# Patient Record
Sex: Male | Born: 1950 | Race: White | Hispanic: No | Marital: Single | Smoking: Current every day smoker
Health system: Southern US, Community
[De-identification: ages and names within clinical notes are randomized; demographics above are authoritative.]

## PROBLEM LIST (undated history)

## (undated) DIAGNOSIS — M1990 Unspecified osteoarthritis, unspecified site: Secondary | ICD-10-CM

## (undated) DIAGNOSIS — C73 Malignant neoplasm of thyroid gland: Secondary | ICD-10-CM

## (undated) DIAGNOSIS — M51369 Other intervertebral disc degeneration, lumbar region without mention of lumbar back pain or lower extremity pain: Secondary | ICD-10-CM

## (undated) DIAGNOSIS — H409 Unspecified glaucoma: Secondary | ICD-10-CM

## (undated) DIAGNOSIS — M545 Low back pain, unspecified: Secondary | ICD-10-CM

## (undated) DIAGNOSIS — C419 Malignant neoplasm of bone and articular cartilage, unspecified: Secondary | ICD-10-CM

## (undated) DIAGNOSIS — R0602 Shortness of breath: Secondary | ICD-10-CM

## (undated) DIAGNOSIS — T4145XA Adverse effect of unspecified anesthetic, initial encounter: Secondary | ICD-10-CM

## (undated) DIAGNOSIS — G4700 Insomnia, unspecified: Secondary | ICD-10-CM

## (undated) DIAGNOSIS — M5136 Other intervertebral disc degeneration, lumbar region: Secondary | ICD-10-CM

## (undated) DIAGNOSIS — F410 Panic disorder [episodic paroxysmal anxiety] without agoraphobia: Secondary | ICD-10-CM

## (undated) DIAGNOSIS — G709 Myoneural disorder, unspecified: Secondary | ICD-10-CM

## (undated) DIAGNOSIS — M792 Neuralgia and neuritis, unspecified: Secondary | ICD-10-CM

## (undated) DIAGNOSIS — F419 Anxiety disorder, unspecified: Secondary | ICD-10-CM

## (undated) DIAGNOSIS — Z923 Personal history of irradiation: Secondary | ICD-10-CM

## (undated) DIAGNOSIS — G8929 Other chronic pain: Secondary | ICD-10-CM

## (undated) HISTORY — DX: Personal history of irradiation: Z92.3

## (undated) HISTORY — DX: Unspecified glaucoma: H40.9

---
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
## 2012-07-08 ENCOUNTER — Ambulatory Visit (INDEPENDENT_AMBULATORY_CARE_PROVIDER_SITE_OTHER): Payer: Federal, State, Local not specified - PPO | Admitting: Family Medicine

## 2012-07-08 VITALS — BP 120/75 | HR 71 | Temp 98.4°F | Resp 16 | Ht 76.0 in | Wt 200.0 lb

## 2012-07-08 DIAGNOSIS — D1779 Benign lipomatous neoplasm of other sites: Secondary | ICD-10-CM

## 2012-07-08 DIAGNOSIS — M47812 Spondylosis without myelopathy or radiculopathy, cervical region: Secondary | ICD-10-CM

## 2012-07-08 DIAGNOSIS — M5116 Intervertebral disc disorders with radiculopathy, lumbar region: Secondary | ICD-10-CM

## 2012-07-08 DIAGNOSIS — D171 Benign lipomatous neoplasm of skin and subcutaneous tissue of trunk: Secondary | ICD-10-CM

## 2012-07-08 DIAGNOSIS — M5126 Other intervertebral disc displacement, lumbar region: Secondary | ICD-10-CM

## 2012-07-08 MED ORDER — CYCLOBENZAPRINE HCL 5 MG PO TABS: ORAL_TABLET | ORAL | Status: DC

## 2012-07-08 MED ORDER — OXAPROZIN 600 MG PO TABS: ORAL_TABLET | ORAL | Status: DC

## 2012-07-08 MED ORDER — PREDNISONE 20 MG PO TABS
ORAL_TABLET | ORAL | Status: DC
Start: 2012-07-08 — End: 2012-07-20

## 2012-07-08 NOTE — Progress Notes (Signed)
Subjective: 15 yr hx of L 3-4 disc disease followed by a chiropracter.  No defined injury. He is retired. He would like to be able to be more active, doing things like cough, but has not been able to play since the spring. He S. pain in his neck, but this didn't relieve by chiropractory and traction. The back has persisted in bothering him with pain in actually he is more in the left hip area radiating down toward the lateral aspect of the knee. He cannot walk very far, and after a quarter mile or so gets bad enough pain that he has to sit down. He likes to lay on his side to sleep, and is not able to do so because of the pain. He feels exhausted.  Objective: Renae Fickle man with stiffness of movement. Neck has some decreased range of motion. Not that painful tender day. Lumbar spine has very decreased side to side motion. Fair flexion, poor extension. He has prominence of the soft tissues overlying the left posterior iliac spine. This is soft, probably lipoma, possibly just spasming. Benign to palpation straight leg raising test is adequate bilaterally. No real tenderness of the greater trochanter.  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

## 2012-07-08 NOTE — Patient Instructions (Signed)
Read the care of the back booklet.  Take medications as directed  If worse return at any time. Otherwise if not doing better over the next couple weeks, come on  Back.

## 2012-07-18 ENCOUNTER — Telehealth: Payer: Self-pay

## 2012-07-18 NOTE — Telephone Encounter (Signed)
RTC, correct?

## 2012-07-18 NOTE — Telephone Encounter (Signed)
Spoke with pt advised to RTC. Pt understood. 

## 2012-07-18 NOTE — Telephone Encounter (Signed)
PT STATES HE IS STILL HAVING HIP AND BACK PAIN AND HAD ALREADY TAKEN ALL THE MEDS. PLEASE CALL [phone]    [facility] ON [address]

## 2012-07-18 NOTE — Telephone Encounter (Signed)
Yes, need RTC.

## 2012-07-20 ENCOUNTER — Ambulatory Visit (INDEPENDENT_AMBULATORY_CARE_PROVIDER_SITE_OTHER): Payer: Federal, State, Local not specified - PPO | Admitting: Emergency Medicine

## 2012-07-20 VITALS — BP 114/75 | HR 97 | Temp 98.4°F | Resp 16 | Ht 76.0 in | Wt 198.0 lb

## 2012-07-20 DIAGNOSIS — M5430 Sciatica, unspecified side: Secondary | ICD-10-CM

## 2012-07-20 DIAGNOSIS — M5116 Intervertebral disc disorders with radiculopathy, lumbar region: Secondary | ICD-10-CM

## 2012-07-20 DIAGNOSIS — D480 Neoplasm of uncertain behavior of bone and articular cartilage: Secondary | ICD-10-CM

## 2012-07-20 MED ORDER — TRAMADOL HCL 50 MG PO TABS: 50.0000 mg | ORAL_TABLET | Freq: Four times a day (QID) | ORAL | Status: AC | PRN

## 2012-07-20 MED ORDER — NAPROXEN SODIUM 550 MG PO TABS: 550.0000 mg | ORAL_TABLET | Freq: Two times a day (BID) | ORAL | Status: DC

## 2012-07-20 NOTE — Progress Notes (Signed)
   Date:  07/20/2012   Name:  Gregory Snow   DOB:  June 27, 1951   MRN:  [id number] Gender: male Age: 61 y.o.  PCP:  No primary provider on file.    Chief Complaint: Follow-up   History of Present Illness:  Gregory Snow is a 61 y.o. pleasant patient who presents with the following:  History of low back pain with pain into his left sciatic notch and left leg.  Says that he has a flareup since June 2023.  Now has been to see a chiropractor after his visit hear with Dr. Quintella Reichert.  Dr Lucretia Field ordered an MRI that was completed yesterday.  Brought a copy of the report with him.  It is significant for an L3 bony mass in the vertebral body.  Differential diagnosis suggested include:  Paget's disease, primary bone malignancy, metastatic lesion, and plasmacytoma.    There is no problem list on file for this patient.   No past medical history on file.  No past surgical history on file.  History  Substance Use Topics  . Smoking status: Current Everyday Smoker  . Smokeless tobacco: Not on file  . Alcohol Use: Not on file    No family history on file.  No Known Allergies  Medication list has been reviewed and updated.  Current Outpatient Prescriptions on File Prior to Visit  Medication Sig Dispense Refill  . cyclobenzaprine (FLEXERIL) 5 MG tablet Take one 3 times daily with food and 2 at bedtime for muscle relaxation  40 tablet  1  . oxaprozin (DAYPRO) 600 MG tablet Take one twice daily with food for pain and inflammation  30 tablet  1    Review of Systems:  As per HPI, otherwise negative.    Physical Examination: Filed Vitals:   07/20/12 1338  BP: 114/75  Pulse: 97  Temp: 98.4 F (36.9 C)  Resp: 16   Filed Vitals:   07/20/12 1338  Height: 6\' 4"  (1.93 m)  Weight: 198 lb (89.812 kg)   Body mass index is 24.10 kg/(m^2). Ideal Body Weight: Weight in (lb) to have BMI = 25: 205   GEN: WDWN, NAD, Non-toxic, A & O x 3 HEENT: Atraumatic, Normocephalic. Neck supple. No  masses, No LAD. Ears and Nose: No external deformity. CV: RRR, No M/G/R. No JVD. No thrill. No extra heart sounds. PULM: CTA B, no wheezes, crackles, rhonchi. No retractions. No resp. distress. No accessory muscle use. ABD: S, NT, ND, +BS. No rebound. No HSM. EXTR: No c/c/e NEURO Normal gait. Weak extension left leg.  DTR's  intact. PSYCH: Normally interactive. Conversant. Not depressed or anxious appearing.  Calm demeanor.    Assessment and Plan: L3 bone lesion  Sciatic neuritis Bone scan plasmaphoresis PSA Neurosurgery consultation   Carmelina Dane, MD

## 2012-07-28 ENCOUNTER — Encounter (HOSPITAL_COMMUNITY): Payer: Self-pay

## 2012-07-28 ENCOUNTER — Ambulatory Visit (HOSPITAL_COMMUNITY): Payer: Self-pay

## 2012-09-04 ENCOUNTER — Telehealth: Payer: Self-pay

## 2012-09-04 DIAGNOSIS — M545 Low back pain: Secondary | ICD-10-CM

## 2012-09-04 NOTE — Telephone Encounter (Signed)
Please take care of arranging referral.

## 2012-09-04 NOTE — Telephone Encounter (Signed)
Have put in referral. Called patient to advise this is pending.

## 2012-09-04 NOTE — Telephone Encounter (Signed)
Pt would like a referral to [facility] neuro to see dr Yetta Barre if possible.

## 2012-09-15 ENCOUNTER — Other Ambulatory Visit: Payer: Self-pay | Admitting: Neurological Surgery

## 2012-09-18 ENCOUNTER — Other Ambulatory Visit: Payer: Self-pay | Admitting: Neurological Surgery

## 2012-09-18 DIAGNOSIS — M48061 Spinal stenosis, lumbar region without neurogenic claudication: Secondary | ICD-10-CM

## 2012-09-18 DIAGNOSIS — C412 Malignant neoplasm of vertebral column: Secondary | ICD-10-CM

## 2012-09-19 ENCOUNTER — Other Ambulatory Visit: Payer: Self-pay | Admitting: Neurological Surgery

## 2012-09-19 DIAGNOSIS — M899 Disorder of bone, unspecified: Secondary | ICD-10-CM | POA: Insufficient documentation

## 2012-09-19 DIAGNOSIS — R109 Unspecified abdominal pain: Secondary | ICD-10-CM

## 2012-09-21 ENCOUNTER — Ambulatory Visit
Admission: RE | Admit: 2012-09-21 | Discharge: 2012-09-21 | Disposition: A | Discharge status: Home / Self Care | Payer: Federal, State, Local not specified - PPO | Source: Ambulatory Visit | Attending: Neurological Surgery | Admitting: Neurological Surgery

## 2012-09-21 DIAGNOSIS — M48061 Spinal stenosis, lumbar region without neurogenic claudication: Secondary | ICD-10-CM

## 2012-09-21 DIAGNOSIS — C412 Malignant neoplasm of vertebral column: Secondary | ICD-10-CM

## 2012-09-22 ENCOUNTER — Ambulatory Visit
Admission: RE | Admit: 2012-09-22 | Discharge: 2012-09-22 | Disposition: A | Discharge status: Home / Self Care | Payer: Federal, State, Local not specified - PPO | Source: Ambulatory Visit | Attending: Neurological Surgery | Admitting: Neurological Surgery

## 2012-09-22 DIAGNOSIS — C419 Malignant neoplasm of bone and articular cartilage, unspecified: Secondary | ICD-10-CM

## 2012-09-22 DIAGNOSIS — R109 Unspecified abdominal pain: Secondary | ICD-10-CM

## 2012-09-22 HISTORY — DX: Malignant neoplasm of bone and articular cartilage, unspecified: C41.9

## 2012-09-22 MED ORDER — IOHEXOL 300 MG/ML  SOLN
100.0000 mL | Freq: Once | INTRAMUSCULAR | Status: AC | PRN
  Administered 2012-09-22: 100 mL via INTRAVENOUS

## 2012-09-26 ENCOUNTER — Ambulatory Visit (INDEPENDENT_AMBULATORY_CARE_PROVIDER_SITE_OTHER): Payer: Federal, State, Local not specified - PPO | Admitting: Family Medicine

## 2012-09-26 VITALS — BP 111/69 | HR 78 | Temp 97.9°F | Resp 18 | Ht 76.0 in | Wt 195.0 lb

## 2012-09-26 DIAGNOSIS — F419 Anxiety disorder, unspecified: Secondary | ICD-10-CM

## 2012-09-26 DIAGNOSIS — M899 Disorder of bone, unspecified: Secondary | ICD-10-CM

## 2012-09-26 DIAGNOSIS — F411 Generalized anxiety disorder: Secondary | ICD-10-CM

## 2012-09-26 DIAGNOSIS — M898X9 Other specified disorders of bone, unspecified site: Secondary | ICD-10-CM

## 2012-09-26 MED ORDER — ALPRAZOLAM 0.5 MG PO TABS: 0.5000 mg | ORAL_TABLET | Freq: Every evening | ORAL | Status: DC | PRN

## 2012-09-26 NOTE — Progress Notes (Signed)
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 He says a cancer is in his spine but he's not sure of the type and further tests are being scheduled to identify the nature of this disease. He has no history of anxiety but periodically during the day for the last week he's had panic attacks and extreme tightness in his chest along with some insomnia.  Objective: Patient space in the room but is appropriate alert and articulate That 20 minutes discussing the patient's situation in reviewing his laboratory results. *RADIOLOGY REPORT*  Clinical Data: Abnormal MRI. L3 expansile bone lesion.  CT CHEST, ABDOMEN AND PELVIS WITH CONTRAST  Technique: Multidetector CT imaging of the chest, abdomen and  pelvis was performed following the standard protocol during bolus  administration of intravenous contrast.  Contrast: OMNIPAQUE IOHEXOL 300 MG/ML SOLN  Comparison: None  CT CHEST  Findings: The chest wall is unremarkable. No supraclavicular or  axillary lymphadenopathy. There are a few small scattered lymph  nodes. The left lobe of the thyroid gland is markedly enlarged and  multinodular in appearance with coarse calcifications. There is  deviation of the trachea to the right. This is most likely a  benign thyroid goiter. The right lobe is normal. A  Examination of the bony thorax demonstrates an expansile and  destructive right scapular lesion. A small lytic lesion in the left  posterior third rib is suspected. No sternal lesions other than a  small sclerotic lesion. There is a destructive T10 vertebral body  lesion posteriorly with probable epidural tumor and mild canal  compromise. Involvement of both pedicles is suspected and a  possible pathologic fracture of the left pedicle. A small lytic  lesion is suspected in the L1 vertebral body. No definite rib or  sternal lesions except for a few tiny  sclerotic left lower rib  lesions which are likely benign.  The heart is normal in size. No pericardial effusion. No  mediastinal or hilar lymphadenopathy. Small scattered lymph nodes  are noted. The esophagus is grossly normal. 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  The descending thoracic aorta is normal in caliber. No dissection.  The pulmonary arteries are slightly enlarged and this could reflect  pulmonary hypertension. Minimal scattered coronary artery  calcifications are noted.  Examination of the lung parenchyma demonstrates no lung mass or  metastatic pulmonary disease. No acute pulmonary findings.  IMPRESSION:  1. Destructive bone lesions involving the right scapula and T10  vertebral body. Small lytic lesions suspected in the left 3rd  posterior rib and L1 vertebral body.  2. No findings for primary lung cancer. No findings for metastatic  lung disease.  3. Multinodular left thyroid goiter.  CT ABDOMEN AND PELVIS  Findings: The liver is unremarkable. No focal hepatic lesions or  intrahepatic biliary dilatation. A calcified granuloma is noted.  The portal and hepatic veins are patent. The gallbladder is  normal. No common bile duct dilatation. The pancreas is normal.  The spleen is normal. The adrenal glands and kidneys are normal.  No renal mass lesion or hydronephrosis.  The stomach, duodenum, small bowel and colon are unremarkable. No  mass lesions or inflammatory changes. The terminal ileum is  normal. No mesenteric or retroperitoneal mass or adenopathy. Small  scattered lymph nodes are noted. The aorta is  normal in caliber.  The major branch vessels are normal.  The bladder, prostate gland and seminal vesicles are unremarkable.  No pelvic mass, adenopathy or free pelvic fluid collections. No  inguinal mass or hernia.  Examination of the bony structures  demonstrates a large destructive  bone lesion involving the left iliac bone with extraosseous tumor  pushing on the iliacus and gluteus muscles. There is also a large  destructive lesion involving the L3 vertebral body as demonstrated  on outside MRI examination. This is invading the spinal canal with  significant compression of the thecal sac and obliteration of the  left neural foramen. A small lytic lesion is suspected in the L1  vertebral body.  IMPRESSION:  1. No CT findings for a primary neoplasm in the abdomen/pelvis.  No findings for metastatic disease involving the solid abdominal  organs and no pathologic adenopathy.  2. Large destructive bone lesions involving the left iliac bone  and L3 vertebral body. The left iliac lesion would be the easiest  to biopsy.  Original Report Authenticated By: Rudie Meyer, M.D.  Assessment: Acute anxiety reaction to stressful situation regarding his health  Plan: 1. Anxiety  ALPRAZolam (XANAX) 0.5 MG tablet

## 2012-09-26 NOTE — Patient Instructions (Addendum)

## 2012-10-10 ENCOUNTER — Telehealth: Payer: Self-pay | Admitting: Oncology

## 2012-10-10 ENCOUNTER — Encounter: Payer: Self-pay | Admitting: Oncology

## 2012-10-10 ENCOUNTER — Ambulatory Visit (HOSPITAL_BASED_OUTPATIENT_CLINIC_OR_DEPARTMENT_OTHER): Payer: Federal, State, Local not specified - PPO | Admitting: Oncology

## 2012-10-10 ENCOUNTER — Ambulatory Visit: Payer: Federal, State, Local not specified - PPO

## 2012-10-10 VITALS — BP 123/72 | HR 89 | Temp 97.6°F | Resp 20 | Ht 76.0 in | Wt 201.1 lb

## 2012-10-10 DIAGNOSIS — R209 Unspecified disturbances of skin sensation: Secondary | ICD-10-CM

## 2012-10-10 DIAGNOSIS — F17200 Nicotine dependence, unspecified, uncomplicated: Secondary | ICD-10-CM

## 2012-10-10 DIAGNOSIS — E079 Disorder of thyroid, unspecified: Secondary | ICD-10-CM

## 2012-10-10 DIAGNOSIS — M949 Disorder of cartilage, unspecified: Secondary | ICD-10-CM

## 2012-10-10 DIAGNOSIS — M899 Disorder of bone, unspecified: Secondary | ICD-10-CM

## 2012-10-10 NOTE — Progress Notes (Signed)
Checked in new patient. He was late, but Dr. Truett Perna said it is ok to check him in.

## 2012-10-10 NOTE — Telephone Encounter (Signed)
C/D 10/10/12 for appt.10/10/12

## 2012-10-10 NOTE — Telephone Encounter (Signed)
Pt appt. With Dr. Truett Perna 10/10/12@3 :00 \ Referring Dr. Yetta Barre Dx-spine mass/lytic bone lesions

## 2012-10-11 ENCOUNTER — Telehealth: Payer: Self-pay | Admitting: *Deleted

## 2012-10-11 ENCOUNTER — Other Ambulatory Visit (HOSPITAL_BASED_OUTPATIENT_CLINIC_OR_DEPARTMENT_OTHER): Payer: Federal, State, Local not specified - PPO

## 2012-10-11 ENCOUNTER — Ambulatory Visit
Admission: RE | Admit: 2012-10-11 | Discharge: 2012-10-11 | Disposition: A | Discharge status: Home / Self Care | Payer: Federal, State, Local not specified - PPO | Source: Ambulatory Visit | Attending: Radiation Oncology | Admitting: Radiation Oncology

## 2012-10-11 ENCOUNTER — Encounter: Payer: Self-pay | Admitting: Radiation Oncology

## 2012-10-11 ENCOUNTER — Encounter: Payer: Self-pay | Admitting: *Deleted

## 2012-10-11 ENCOUNTER — Other Ambulatory Visit: Payer: Self-pay | Admitting: Radiology

## 2012-10-11 VITALS — BP 137/81 | HR 83 | Temp 98.2°F | Resp 20 | Ht 76.0 in | Wt 204.0 lb

## 2012-10-11 DIAGNOSIS — C7951 Secondary malignant neoplasm of bone: Secondary | ICD-10-CM | POA: Insufficient documentation

## 2012-10-11 DIAGNOSIS — C419 Malignant neoplasm of bone and articular cartilage, unspecified: Secondary | ICD-10-CM

## 2012-10-11 DIAGNOSIS — M5136 Other intervertebral disc degeneration, lumbar region: Secondary | ICD-10-CM | POA: Insufficient documentation

## 2012-10-11 DIAGNOSIS — M899 Disorder of bone, unspecified: Secondary | ICD-10-CM

## 2012-10-11 DIAGNOSIS — Z79899 Other long term (current) drug therapy: Secondary | ICD-10-CM | POA: Insufficient documentation

## 2012-10-11 DIAGNOSIS — Z510 Encounter for antineoplastic radiation therapy: Secondary | ICD-10-CM | POA: Insufficient documentation

## 2012-10-11 DIAGNOSIS — M898X9 Other specified disorders of bone, unspecified site: Secondary | ICD-10-CM

## 2012-10-11 DIAGNOSIS — C7952 Secondary malignant neoplasm of bone marrow: Secondary | ICD-10-CM

## 2012-10-11 DIAGNOSIS — F419 Anxiety disorder, unspecified: Secondary | ICD-10-CM | POA: Insufficient documentation

## 2012-10-11 DIAGNOSIS — F410 Panic disorder [episodic paroxysmal anxiety] without agoraphobia: Secondary | ICD-10-CM | POA: Insufficient documentation

## 2012-10-11 DIAGNOSIS — C73 Malignant neoplasm of thyroid gland: Secondary | ICD-10-CM | POA: Insufficient documentation

## 2012-10-11 HISTORY — DX: Anxiety disorder, unspecified: F41.9

## 2012-10-11 HISTORY — DX: Insomnia, unspecified: G47.00

## 2012-10-11 HISTORY — DX: Other intervertebral disc degeneration, lumbar region without mention of lumbar back pain or lower extremity pain: M51.369

## 2012-10-11 HISTORY — DX: Unspecified osteoarthritis, unspecified site: M19.90

## 2012-10-11 HISTORY — DX: Malignant neoplasm of bone and articular cartilage, unspecified: C41.9

## 2012-10-11 HISTORY — DX: Panic disorder (episodic paroxysmal anxiety): F41.0

## 2012-10-11 HISTORY — DX: Other intervertebral disc degeneration, lumbar region: M51.36

## 2012-10-11 LAB — CBC WITH DIFFERENTIAL/PLATELET
Basophils Absolute: 0 10*3/uL (ref 0.0–0.1)
Eosinophils Absolute: 0.2 10*3/uL (ref 0.0–0.5)
HCT: 43 % (ref 38.4–49.9)
HGB: 14.6 g/dL (ref 13.0–17.1)
LYMPH%: 10.1 % — ABNORMAL LOW (ref 14.0–49.0)
MCHC: 33.9 g/dL (ref 32.0–36.0)
MONO#: 0.8 10*3/uL (ref 0.1–0.9)
NEUT#: 9 10*3/uL — ABNORMAL HIGH (ref 1.5–6.5)
NEUT%: 80.5 % — ABNORMAL HIGH (ref 39.0–75.0)
Platelets: 199 10*3/uL (ref 140–400)
WBC: 11.1 10*3/uL — ABNORMAL HIGH (ref 4.0–10.3)
lymph#: 1.1 10*3/uL (ref 0.9–3.3)

## 2012-10-11 LAB — COMPREHENSIVE METABOLIC PANEL (CC13)
ALT: 16 U/L (ref 0–55)
CO2: 27 mEq/L (ref 22–29)
Calcium: 9.3 mg/dL (ref 8.4–10.4)
Chloride: 104 mEq/L (ref 98–107)
Creatinine: 0.9 mg/dL (ref 0.7–1.3)
Sodium: 142 mEq/L (ref 136–145)
Total Protein: 7 g/dL (ref 6.4–8.3)

## 2012-10-11 LAB — LACTATE DEHYDROGENASE (CC13): LDH: 174 U/L (ref 125–245)

## 2012-10-11 NOTE — Progress Notes (Signed)
Simulation/treatment planning note: The patient was taken to the CT simulator. A Vac Loc was constructed for immobilization. His abdomen/pelvis was scanned. I chose and isocenter along L3 and the other isocenter along the left iliac bone and areas of lytic destruction. He was set up to AP PA to his L3 vertebra and 2 separate multileaf collimators were designed to conform the field. He was set up to LAO and RPO to his left iliac bone to avoid the bowel and 2 additional unique multileaf collimators were designed to conform the field. She it will of 5 complex treatment devices. To both areas I am prescribing 3500 cGy in 14 sessions. The prescription may change based on the results of his biopsy scheduled for this Friday. Dosimetry and an isodose plan is requested.

## 2012-10-11 NOTE — Progress Notes (Signed)
[facility] New Patient Consult   Referring MD: Louis Bolsinger 61 y.o.  1951-10-31    Reason for Referral: Multiple destructive bone lesions     HPI: He reports a history of pain in the left "hip "area and left lower leg since May of this year. He initially saw a Land. The symptoms persisted after treatment with a chiropractor and an MRI was obtained at [facility] in approximately September of this year (we do not have the MRI report available today).  He was referred to Dr. Marikay Alar and staging CTs were obtained on 09/22/2012. Destructive bone lesions were noted at the right scapula and T10. Small lytic lesions were suspected at the left third posterior rib and L1 vertebral body. No evidence of a primary lung tumor. A multinodular left thyroid goiter was seen. No focal hepatic lesions, the pancreas, spleen, adrenal glands, and kidneys appeared normal. No mesenteric or retroperitoneal mass or adenopathy. The bladder and prostate gland appeared unremarkable. A large destructive lesion was noted at the left iliac bone with extraosseous tumor. A large lesion was noted at L3. This lesion invades the spinal canal with significant compression of the thecal sac and obliteration of the left neural foramen. He reports no relief with 2 courses of steroids given over the past several months. The pain is partially relieved with hydrocodone.  Past medical history: 1. Chronic low back pain  Past surgical history: 1. Adenoid surgery as a child  Family History  Problem Relation Age of Onset  . Cancer Brother  37s     prostate ca    Current outpatient prescriptions:ALPRAZolam (XANAX) 0.5 MG tablet, Take 0.25 mg by mouth 2 (two) times daily as needed., Disp: , Rfl: ;  Calcium Carb-Cholecalciferol (CALCIUM 1000 + D PO), Take by mouth., Disp: , Rfl: ;  glucosamine-chondroitin 500-400 MG tablet, Take 2 tablets by mouth daily. Extra strength, Disp: , Rfl: ;   HYDROcodone-acetaminophen (NORCO) 10-325 MG per tablet, Take 1 tablet by mouth every 6 (six) hours as needed. , Disp: , Rfl:  ibuprofen (ADVIL,MOTRIN) 200 MG tablet, Take 200 mg by mouth every 6 (six) hours as needed. Ibuprofen pm at night, Disp: , Rfl: ;  Misc Natural Products (GINSENG COMPLEX PO), Take 1 capsule by mouth daily., Disp: , Rfl: ;  vitamin C (ASCORBIC ACID) 500 MG tablet, Take 500 mg by mouth daily., Disp: , Rfl: ;  cyclobenzaprine (FLEXERIL) 5 MG tablet, Take one 3 times daily with food and 2 at bedtime for muscle relaxation, Disp: 40 tablet, Rfl: 1 Fish Oil-Cholecalciferol (FISH OIL + D3 PO), Take by mouth. On hold for procedure, Disp: , Rfl: ;  methylPREDNIsolone (MEDROL DOSPACK) 4 MG tablet, Take 4 mg by mouth as directed. As directed, Disp: , Rfl: ;  oxaprozin (DAYPRO) 600 MG tablet, Take one twice daily with food for pain and inflammation, Disp: 30 tablet, Rfl: 1  Allergies: No Known Allergies  Social History: He lives alone in [place]. He is retired from the [employer]. He smokes less than one half pack of cigarettes per day. Occasional alcohol use. No transfusion history. He has a girlfriend. He denies risk factors for HIV and hepatitis.  ROS:   Positives include: Intermittent dyspnea-she relates this to anxiety, intermittent blood in the stool over the years-last greater than one year ago, intermittent numbness in the left lower leg, rash over the chest noted a few days ago, pain at the left iliac and left leg  A complete ROS was otherwise negative.  Physical Exam:  Blood pressure 123/72, pulse 89, temperature 97.6 F (36.4 C), temperature source Oral, resp. rate 20, height 6\' 4"  (1.93 m), weight 201 lb 1.6 oz (91.218 kg).  HEENT: Oropharynx without visible mass, there is a firm mass in the left lower medial neck near the thyroid. The mass appears to extend across the midline. Lungs: Clear bilaterally Cardiac: Regular rate and rhythm Abdomen: No hepatomegaly,  nontender, no mass GU: Testes without mass  Vascular: No leg edema Lymph nodes: No cervical, supra-clavicular, axillary, or inguinal nodes Neurologic: Alert and oriented, the motor exam appears intact in the upper extremities, legs, and feet Skin: There are 1-2 mm erythematous pimple-type lesions over the back and chest Musculoskeletal: No spine tenderness.   LAB: To be obtained on 10/11/2012    Radiology: I reviewed the 09/22/2012 CT with Mr. Bouch, there are multiple destructive bone lesions with a soft tissue component.    Assessment/Plan:   1. Multiple destructive bone lesions including a large lesion at L3 with a soft-tissue component encroaching on the thecal sac  2. Pain secondary to the L3 mass with compression of the left L3 nerve root. The pain may also be in part related to the left iliac mass  3. Tobacco use  4. Left thyroid mass-? Goiter, a primary thyroid malignancy, or metastatic disease   Disposition:   Mr. Mcmanus presents with pain in the left low back/pelvis and left leg. An MRI and CT scans have confirmed multiple destructive bone lesions with extraosseous tumor.  He understands these lesions likely represent a malignancy. The differential diagnosis includes a hematopoietic malignancy such as multiple myeloma or non-Hodgkin's lymphoma. He may also have a metastatic carcinoma.  He is scheduled for a CT-guided biopsy of the left iliac mass on 10/13/2012. We will make a referral to Dr. Dayton Scrape to consider palliative radiation to the lumbar spine given the appearance of this lesion and his radicular pain. He knows to contact us if he develops new neurologic symptoms. He will continue hydrocodone for pain.  Mr. Hoopes will return for an office visit on 10/16/2012. A CBC, chemistry panel, LDH, and myeloma panel will be obtained on 10/11/2012. We will initiate systemic therapy based on the biopsy result.  Keiana Tavella 10/11/2012, 9:19 AM

## 2012-10-11 NOTE — Telephone Encounter (Signed)
Gave patient appointment for 10-16-2012 on 10-11-2012 while in the lab department

## 2012-10-11 NOTE — Progress Notes (Signed)
New Consult Bone metastasis CT 09/22/12=Right scapula,T10, L1,Left iliac, Left 3rd rib involvement Biopsy scheduled for 10/13/12 Single, 1 son  Alert,oriented x 3, took Ibuprofen today 0830am, no pain at present, before it was in lower left sacrum,hip,radiating down leg Brother prostate cancer dx age 61, living  Labs today      Allergies:NKDA No hx Radiation, current smoker- 6-8 cigarettes daily

## 2012-10-11 NOTE — Addendum Note (Signed)
Encounter addended by: Geniece Akers Mintz Zaynab Chipman, RN on: 10/11/2012  7:22 PM<BR>     Documentation filed: Charges VN

## 2012-10-11 NOTE — Progress Notes (Signed)
Please see the Nurse Progress Note in the MD Initial Consult Encounter for this patient. 

## 2012-10-11 NOTE — Progress Notes (Signed)
[facility] Radiation Oncology NEW PATIENT EVALUATION  Name: Gregory Snow MRN: [id number]  Date:   10/11/2012           DOB: 1951-02-14  Status: outpatient   CC: No primary provider on file.  Ladene Artist, MD , Dr. Marikay Alar   REFERRING PHYSICIAN: Ladene Artist, MD   DIAGNOSIS: The primary encounter diagnosis was Bone cancer. A diagnosis of Lytic bone lesions on xray was also pertinent to this visit.    HISTORY OF PRESENT ILLNESS:  Gregory Snow is a 61 y.o. male who is seen today for the courtesy Dr. 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 He gives a history of chronic low back discomfort for which she has seen a chiropractor, Dr.  Lucretia Field, who has been able to help him out. This past May he noted left hip discomfort which did not improve with chiropractic care. He was sent for a MRI scan at [facility] which apparently showed a lytic lesion at L3 and also left iliac bone. The scan is not available for review at the time of this dictation. He was next seen by Dr. Marikay Alar who performed a CT scan of the chest, abdomen and pelvis. His scans of the chest showed a destructive bone lesions involving the right scapula and T10 vertebral body with small lytic lesions in the left third posterior rib and L1 vertebral body. There is also felt to be a multinodular left thyroid goiter. Scans of the abdomen and pelvis showed a large instructed lesion along the left iliac bone and also left L3 vertebral body. The patient was referred to Dr. Truett Perna for further evaluation. The patient is scheduled for a biopsy of his left iliac bone this Friday. He's had relatively little pain. He does have hydrocodone but he does not take this. He does take ibuprofen when necessary. He was set up for lab work earlier today through Dr. Truett Perna to work up the possibility of myeloma . He denies lower extremity numbness  or weakness. He does report occasional tingling along his right shoulder but no scapular pain. He states that his left hip pain is positional and intermittent. No difficulty with ambulation. The left hip pain radiates to his left by but not to the lower extremity. I should mention that he had two "rounds of steroids", one in September and then another in October. No history of weight loss or respiratory symptoms. PREVIOUS RADIATION THERAPY: No   PAST MEDICAL HISTORY:  has a past medical history of Insomnia; Bone cancer (09/22/12); Anxiety; Panic attacks; Arthritis; and DDD (degenerative disc disease), lumbar.     PAST SURGICAL HISTORY:  Past Surgical History  Procedure Date  . Adnoids     as a child, left tonsils in     FAMILY HISTORY: family history includes Cancer (age of onset:60) in his brother. His father died of congestive heart failure and complications of diabetes and 84. His mother died following a motor vehicle accident and 61.   SOCIAL HISTORY:  reports that he has been smoking.  He has never used smokeless tobacco. He reports that he drinks alcohol. He reports that he does not use illicit drugs. Single, has a significant other. 33 year old son. 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   ALLERGIES: Review of patient's allergies indicates no known allergies.   MEDICATIONS:  Current Outpatient Prescriptions  Medication Sig Dispense Refill  .  ALPRAZolam (XANAX) 0.5 MG tablet Take 0.25 mg by mouth 2 (two) times daily as needed.      . cyclobenzaprine (FLEXERIL) 5 MG tablet Take one 3 times daily with food and 2 at bedtime for muscle relaxation  40 tablet  1  . HYDROcodone-acetaminophen (NORCO) 10-325 MG per tablet Take 1 tablet by mouth every 6 (six) hours as needed.       Marland Kitchen ibuprofen (ADVIL,MOTRIN) 200 MG tablet Take 200 mg by mouth every 6 (six) hours as needed. Ibuprofen pm at night      . Ibuprofen-Diphenhydramine Cit (RA IBUPROFEN PM PO) Take 2  capsules by mouth at bedtime.      . Misc Natural Products (GINSENG COMPLEX PO) Take 1 capsule by mouth daily.      Marland Kitchen OVER THE COUNTER MEDICATION Take 1 capsule by mouth daily. curamin natural otc capsule      . vitamin C (ASCORBIC ACID) 500 MG tablet Take 500 mg by mouth daily.      . Calcium Carb-Cholecalciferol (CALCIUM 1000 + D PO) Take 1,200 mg by mouth daily. With vit d3 1000 iu      . Fish Oil-Cholecalciferol (FISH OIL + D3 PO) Take by mouth. On hold for procedure      . glucosamine-chondroitin 500-400 MG tablet Take 2 tablets by mouth daily. Extra strength         REVIEW OF SYSTEMS:  Pertinent items are noted in HPI.    PHYSICAL EXAM:  height is 6\' 4"  (1.93 m) and weight is 204 lb (92.534 kg). His oral temperature is 98.2 F (36.8 C). His blood pressure is 137/81 and his pulse is 83. His respiration is 20.  He looks well but is somewhat anxious. Head and neck examination: Grossly unremarkable with the exception of fullness along his left lower neck consistent with a goiter. Nodes: Without palpable cervical, or supraclavicular lymphadenopathy. Chest: Lungs clear. Back: Without palpable spinal discomfort. Specifically, no pain on palpation along L3. Abdomen soft without masses organomegaly. Pelvis: There is slight discomfort on palpation along left iliac bone. The left hip has full range of motion. Extremities: Without edema. Neurologic examination: Grossly nonfocal. Left lower extremity strength is normal and deep tendon reflexes are 2+ bilaterally. Sensation is normal to light touch.    LABORATORY DATA:  Lab Results  Component Value Date   WBC 11.1* 10/11/2012   HGB 14.6 10/11/2012   HCT 43.0 10/11/2012   MCV 97.6 10/11/2012   PLT 199 10/11/2012   Lab Results  Component Value Date   NA 142 10/11/2012   K 4.4 10/11/2012   CL 104 10/11/2012   CO2 27 10/11/2012   Lab Results  Component Value Date   ALT 16 10/11/2012   AST 13 10/11/2012   ALKPHOS 69 10/11/2012   BILITOT  0.91 10/11/2012      IMPRESSION: Multiple lytic lesions consistent with either metastatic carcinoma to bone or primary bone malignancy. He has a long-standing cigarette smoking history he, so I think we have to be concerned about metastatic carcinoma of the lung. Other possibilities include metastatic kidney cancer, and less likely metastatic thyroid cancer. Multiple myeloma, and non-Hodgkin's lymphoma would also be considerations. He'll have his left iliac bone biopsy this Friday and this will guide his systemic and radiation therapy. We'll move ahead with CT simulation of his L3 vertebra because of his extensive tumor which does involve the neural canal, but fortunately he has self-decompressed by lysis of his left pedicle/vertebral body. I will  also set him up for simulation to his left iliac bone for palliation. He'll undergo treatment planning today and we will not begin his radiation therapy until Tuesday, December 3 when we have tissue confirmation of malignancy. His radiation dose will depend on his diagnosis. He may receive 2-1/2  to 3-1/2 weeks of radiation therapy. I'll coordinating his care with Dr. Truett Perna.   PLAN: As discussed above.  I spent 60 minutes minutes face to face with the patient and more than 50% of that time was spent in counseling and/or coordination of care.

## 2012-10-11 NOTE — Telephone Encounter (Signed)
Called patient home phone same as his cell phone, left message about appt time 1030 am today,please bring medications,left voice message 8:23 AM

## 2012-10-13 ENCOUNTER — Encounter (HOSPITAL_COMMUNITY): Payer: Self-pay

## 2012-10-13 ENCOUNTER — Ambulatory Visit (HOSPITAL_COMMUNITY)
Admission: RE | Admit: 2012-10-13 | Discharge: 2012-10-13 | Disposition: A | Discharge status: Home / Self Care | Payer: Federal, State, Local not specified - PPO | Source: Ambulatory Visit | Attending: Oncology | Admitting: Oncology

## 2012-10-13 DIAGNOSIS — F17200 Nicotine dependence, unspecified, uncomplicated: Secondary | ICD-10-CM | POA: Insufficient documentation

## 2012-10-13 DIAGNOSIS — M899 Disorder of bone, unspecified: Secondary | ICD-10-CM

## 2012-10-13 DIAGNOSIS — C73 Malignant neoplasm of thyroid gland: Secondary | ICD-10-CM | POA: Insufficient documentation

## 2012-10-13 DIAGNOSIS — C7951 Secondary malignant neoplasm of bone: Secondary | ICD-10-CM | POA: Insufficient documentation

## 2012-10-13 DIAGNOSIS — C7952 Secondary malignant neoplasm of bone marrow: Secondary | ICD-10-CM | POA: Insufficient documentation

## 2012-10-13 HISTORY — PX: OTHER SURGICAL HISTORY: SHX169

## 2012-10-13 LAB — CBC
MCH: 32.2 pg (ref 26.0–34.0)
MCV: 95.1 fL (ref 78.0–100.0)
Platelets: 204 10*3/uL (ref 150–400)
RBC: 4.28 MIL/uL (ref 4.22–5.81)
RDW: 13.2 % (ref 11.5–15.5)
WBC: 10.2 10*3/uL (ref 4.0–10.5)

## 2012-10-13 LAB — PROTIME-INR: Prothrombin Time: 12.5 seconds (ref 11.6–15.2)

## 2012-10-13 MED ORDER — FENTANYL CITRATE 0.05 MG/ML IJ SOLN
INTRAMUSCULAR | Status: AC
  Filled 2012-10-13: qty 6

## 2012-10-13 MED ORDER — SODIUM CHLORIDE 0.9 % IV SOLN
INTRAVENOUS | Status: DC
  Administered 2012-10-13: 08:00:00 via INTRAVENOUS

## 2012-10-13 MED ORDER — MIDAZOLAM HCL 2 MG/2ML IJ SOLN
INTRAMUSCULAR | Status: AC | PRN
  Administered 2012-10-13: 1 mg via INTRAVENOUS
  Administered 2012-10-13: 2 mg via INTRAVENOUS
  Administered 2012-10-13: 1 mg via INTRAVENOUS

## 2012-10-13 MED ORDER — MIDAZOLAM HCL 2 MG/2ML IJ SOLN
INTRAMUSCULAR | Status: AC
  Filled 2012-10-13: qty 6

## 2012-10-13 MED ORDER — FENTANYL CITRATE 0.05 MG/ML IJ SOLN
INTRAMUSCULAR | Status: AC | PRN
  Administered 2012-10-13: 50 ug via INTRAVENOUS
  Administered 2012-10-13: 100 ug via INTRAVENOUS
  Administered 2012-10-13: 50 ug via INTRAVENOUS

## 2012-10-13 NOTE — Procedures (Signed)
Technically successful CT guided biopsy of lytic lesion involving the left ilium.  No immediate post procedural complications.

## 2012-10-13 NOTE — H&P (Signed)
Chief Complaint: "I'm here for a bone biopsy" Referring Physician:Sherrill HPI: Gregory Snow is an 61 y.o. male with c/o hip and leg pain and workup has found multiple bone lesions. He is referred for biopsy of one of these lesions. PMHx and meds reviewed  Past Medical History:  Past Medical History  Diagnosis Date  . Insomnia   . 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 Anxiety     new dx  . Panic attacks   . Arthritis     cervical  . DDD (degenerative disc disease), lumbar     hx L-3_4     Past Surgical History:  Past Surgical History  Procedure Date  . Adnoids     as a child, left tonsils in    Family History:  Family History  Problem Relation Age of Onset  . Cancer Brother 34    prostate ca/ surgically     Social History:  reports that he has been smoking Cigarettes.  He has a 15 pack-year smoking history. He has never used smokeless tobacco. He reports that he drinks alcohol. He reports that he does not use illicit drugs.  Allergies: No Known Allergies  Medications: Calcium Carb-Cholecalciferol (CALCIUM 1000 + D PO) Sig - Route: Take 1,200 mg by mouth daily. With vit d3 1000 iu - Oral Class: Historical Med Number of times this order has been changed since signing: 2 Order [address] Fish Oil-Cholecalciferol (FISH OIL + D3 PO) Sig - Route: Take by mouth. On hold for procedure - Oral Class: Historical Med Number of times this order has been changed since signing: 1 Order [address] glucosamine-chondroitin 500-400 MG tablet Sig - Route: Take 2 tablets by mouth daily. Extra strength - Oral Class: Historical Med Number of times this order has been changed since signing: 2 Order [address] HYDROcodone-acetaminophen (NORCO) 10-325 MG per tablet Sig - Route: Take 1 tablet by mouth every 6 (six) hours as needed. - Oral Class: Historical Med Number of times this order has been changed since signing: 2 Order [address] ibuprofen (ADVIL,MOTRIN) 200 MG tablet Sig -  Route: Take 200 mg by mouth every 6 (six) hours as needed. Ibuprofen pm at night - Oral Class: Historical Med Number of times this order has been changed since signing: 1 Order [address] vitamin C (ASCORBIC ACID) 500 MG tablet Sig - Route: Take 500 mg by mouth daily.  Please HPI for pertinent positives, otherwise complete 10 system ROS negative.  Physical Exam: Blood pressure 119/73, pulse 66, temperature 98.2 F (36.8 C), temperature source Oral, resp. rate 18, SpO2 98.00%. There is no height or weight on file to calculate BMI.   General Appearance:  Alert, cooperative, no distress, appears stated age  Head:  Normocephalic, without obvious abnormality, atraumatic  ENT: Unremarkable  Neck: Supple, symmetrical, trachea midline, no adenopathy, thyroid: not enlarged, symmetric, no tenderness/mass/nodules  Lungs:   Clear to auscultation bilaterally, no w/r/r, respirations unlabored without use of accessory muscles.  Chest Wall:  No tenderness or deformity  Heart:  Regular rate and rhythm, S1, S2 normal, no murmur, rub or gallop. Carotids 2+ without bruit.  Neurologic: Normal affect, no gross deficits.   Results for orders placed during the hospital encounter of 10/13/12 (from the past 48 hour(s))  APTT     Status: Normal   Collection Time   10/13/12  7:40 AM      Component Value Range Comment   aPTT 28  24 - 37 seconds   CBC     Status: Normal   Collection Time   10/13/12  7:40 AM      Component Value Range Comment   WBC 10.2  4.0 - 10.5 K/uL    RBC 4.28  4.22 - 5.81 MIL/uL    Hemoglobin 13.8  13.0 - 17.0 g/dL    HCT 16.1  09.6 - 04.5 %    MCV 95.1  78.0 - 100.0 fL    MCH 32.2  26.0 - 34.0 pg    MCHC 33.9  30.0 - 36.0 g/dL    RDW 40.9  81.1 - 91.4 %    Platelets 204  150 - 400 K/uL   PROTIME-INR     Status: Normal   Collection Time   10/13/12  7:40 AM      Component Value Range Comment   Prothrombin Time 12.5  11.6 - 15.2 seconds    INR 0.94  0.00 - 1.49    No results  found.  Assessment/Plan (L)iliac crest lytic bone lesion CT guided biopsy discussed including risks and complications Labs ok Consent signed in chart  Brayton El PA-C 10/13/2012, 8:18 AM

## 2012-10-16 ENCOUNTER — Ambulatory Visit (HOSPITAL_BASED_OUTPATIENT_CLINIC_OR_DEPARTMENT_OTHER): Payer: Federal, State, Local not specified - PPO | Admitting: Oncology

## 2012-10-16 ENCOUNTER — Other Ambulatory Visit: Payer: Self-pay | Admitting: Oncology

## 2012-10-16 ENCOUNTER — Telehealth: Payer: Self-pay | Admitting: Oncology

## 2012-10-16 VITALS — BP 134/79 | HR 83 | Temp 97.7°F | Resp 18 | Ht 76.0 in | Wt 198.2 lb

## 2012-10-16 DIAGNOSIS — M899 Disorder of bone, unspecified: Secondary | ICD-10-CM

## 2012-10-16 DIAGNOSIS — C7951 Secondary malignant neoplasm of bone: Secondary | ICD-10-CM

## 2012-10-16 DIAGNOSIS — F17200 Nicotine dependence, unspecified, uncomplicated: Secondary | ICD-10-CM

## 2012-10-16 DIAGNOSIS — G893 Neoplasm related pain (acute) (chronic): Secondary | ICD-10-CM

## 2012-10-16 DIAGNOSIS — C73 Malignant neoplasm of thyroid gland: Secondary | ICD-10-CM

## 2012-10-16 LAB — KAPPA/LAMBDA LIGHT CHAINS: Kappa:Lambda Ratio: 0.91 (ref 0.26–1.65)

## 2012-10-16 LAB — SPEP & IFE WITH QIG
Alpha-1-Globulin: 4 % (ref 2.9–4.9)
Alpha-2-Globulin: 11.5 % (ref 7.1–11.8)
Beta 2: 4.4 % (ref 3.2–6.5)
Gamma Globulin: 11.8 % (ref 11.1–18.8)

## 2012-10-16 NOTE — Telephone Encounter (Signed)
appt made and printed for pt pt aware that cen sch will call with u/s appt     anne

## 2012-10-16 NOTE — Progress Notes (Signed)
   [facility]    OFFICE PROGRESS NOTE   INTERVAL HISTORY:   He returns as scheduled. He reports stable discomfort at the left lower back and left leg. No new symptoms. He underwent a biopsy of the left iliac mass on 10/13/2012. He tolerated procedure well.  Objective:  Vital signs in last 24 hours:  Blood pressure 134/79, pulse 83, temperature 97.7 F (36.5 C), temperature source Oral, resp. rate 18, height 6\' 4"  (1.93 m), weight 198 lb 3.2 oz (89.903 kg).    HEENT: Firm mass in the left lower neck extending to the midline. Lymphatics: No cervical or supraclavicular nodes Resp: Lungs clear bilaterally Cardio: Regular rate and rhythm Skin: Left iliac biopsy site without evidence of infection     Lab Results:  Lab Results  Component Value Date   WBC 10.2 10/13/2012   HGB 13.8 10/13/2012   HCT 40.7 10/13/2012   MCV 95.1 10/13/2012   PLT 204 10/13/2012   ANC 9.0 on 10/11/2012 LDH 174 on the 27th 2013 BUN 21, current 0.9, calcium 9.3, abdomen 4.2 on 10/11/2012.  Medications: I have reviewed the patient's current medications.  Assessment/Plan: 1.metastatic thyroid cancer, palpable left thyroid mass confirmed on a CT  09/22/2012         -Multiple destructive bone lesions including a large lesion at L3 with a soft-tissue component encroaching on the thecal sac   2. Pain secondary to the L3 mass with compression of the left L3 nerve root. The pain is also also related to the left iliac mass   3. Tobacco use   Disposition:  I reviewed the iliac biopsy with Dr. Luisa Hart. The biopsy confirms a differentiated thyroid cancer. Immunohistochemical stains are pending. I discussed the diagnosis and treatment plan with Mr.Reihl. I discussed the case with Dr. Dayton Scrape. Dr. Dayton Scrape recommends proceeding with external beam radiation to the left iliac and lumbar masses.  We will make a surgical referral for a thyroidectomy procedure. This can be followed by a radioactive  iodine uptake scan and then therapeutic radioactive iodine as indicated.  He will continue hydrocodone and ibuprofen as needed for pain. He will contact us for increased pain or new symptoms.  He will return for an office visit at the completion of radiation. We will schedule a surgical consultation in the interim. I will Dr. Yetta Barre to consider the indication for a spine stabilization procedure.  We will add a thyroglobulin level to the labs from 10/11/2012.   Thornton Papas, MD  10/16/2012  2:14 PM

## 2012-10-17 ENCOUNTER — Ambulatory Visit (HOSPITAL_COMMUNITY)
Admission: RE | Admit: 2012-10-17 | Discharge: 2012-10-17 | Disposition: A | Discharge status: Home / Self Care | Payer: Federal, State, Local not specified - PPO | Source: Ambulatory Visit | Attending: Oncology | Admitting: Oncology

## 2012-10-17 ENCOUNTER — Ambulatory Visit
Admission: RE | Admit: 2012-10-17 | Discharge: 2012-10-17 | Disposition: A | Discharge status: Home / Self Care | Payer: Federal, State, Local not specified - PPO | Source: Ambulatory Visit | Attending: Radiation Oncology | Admitting: Radiation Oncology

## 2012-10-17 DIAGNOSIS — C73 Malignant neoplasm of thyroid gland: Secondary | ICD-10-CM | POA: Insufficient documentation

## 2012-10-17 DIAGNOSIS — M899 Disorder of bone, unspecified: Secondary | ICD-10-CM | POA: Insufficient documentation

## 2012-10-17 DIAGNOSIS — C7951 Secondary malignant neoplasm of bone: Secondary | ICD-10-CM

## 2012-10-17 DIAGNOSIS — M949 Disorder of cartilage, unspecified: Secondary | ICD-10-CM | POA: Insufficient documentation

## 2012-10-17 NOTE — Progress Notes (Signed)
Simulation verification note: The patient underwent similar to verification for treatment to his L3 vertebra and also left iliac the isocenters were in good position and the multileaf collimators contoured the treatment volume appropriately.

## 2012-10-18 ENCOUNTER — Ambulatory Visit: Payer: Federal, State, Local not specified - PPO | Admitting: Internal Medicine

## 2012-10-18 ENCOUNTER — Other Ambulatory Visit: Payer: Self-pay | Admitting: Oncology

## 2012-10-18 ENCOUNTER — Ambulatory Visit
Admission: RE | Admit: 2012-10-18 | Discharge: 2012-10-18 | Disposition: A | Discharge status: Home / Self Care | Payer: Federal, State, Local not specified - PPO | Source: Ambulatory Visit | Attending: Radiation Oncology | Admitting: Radiation Oncology

## 2012-10-18 ENCOUNTER — Other Ambulatory Visit: Payer: Federal, State, Local not specified - PPO | Admitting: Lab

## 2012-10-18 ENCOUNTER — Ambulatory Visit: Payer: Federal, State, Local not specified - PPO

## 2012-10-18 ENCOUNTER — Telehealth: Payer: Self-pay | Admitting: *Deleted

## 2012-10-18 DIAGNOSIS — C73 Malignant neoplasm of thyroid gland: Secondary | ICD-10-CM

## 2012-10-18 LAB — THYROGLOBULIN PANEL: Thyroglobulin: 11514 ng/mL — ABNORMAL HIGH (ref 0.0–55.0)

## 2012-10-18 NOTE — Telephone Encounter (Signed)
Per Dr. Truett Perna; called and spoke with pt informing him that Thyroid US confirmed CA; also that Dr. Truett Perna has contacted Dr. Suzanna Obey as referral and their office will call pt to set up appt.  Pt verbalized understanding.

## 2012-10-19 ENCOUNTER — Ambulatory Visit
Admission: RE | Admit: 2012-10-19 | Discharge: 2012-10-19 | Disposition: A | Discharge status: Home / Self Care | Payer: Federal, State, Local not specified - PPO | Source: Ambulatory Visit | Attending: Radiation Oncology | Admitting: Radiation Oncology

## 2012-10-19 ENCOUNTER — Telehealth: Payer: Self-pay | Admitting: *Deleted

## 2012-10-19 NOTE — Telephone Encounter (Signed)
Gave referral sheet and demographics and printed out referral and gave to medical records department to fax over to [phone] md has referred the patient to dr.john byers

## 2012-10-20 ENCOUNTER — Telehealth: Payer: Self-pay | Admitting: *Deleted

## 2012-10-20 ENCOUNTER — Telehealth: Payer: Self-pay | Admitting: Oncology

## 2012-10-20 ENCOUNTER — Ambulatory Visit
Admission: RE | Admit: 2012-10-20 | Discharge: 2012-10-20 | Disposition: A | Discharge status: Home / Self Care | Payer: Federal, State, Local not specified - PPO | Source: Ambulatory Visit | Attending: Radiation Oncology | Admitting: Radiation Oncology

## 2012-10-20 NOTE — Telephone Encounter (Addendum)
Returned call to pt;----pt requesting Dr. Truett Perna to write a letter excusing him from jury duty.  Pt states his jury duty # [phone] and to please mail letter to home address as soon as possible.  Note to Dr. Truett Perna.    Per Dr. Truett Perna, returned call to pt requesting pt to bring letter in for Dr. Truett Perna to see.  Pt verbalized understanding.

## 2012-10-20 NOTE — Telephone Encounter (Signed)
Robbi Garter (friend of pt--on release form) called wanting questions answered regarding pt condition and biopsy report results.  Ms. Bobetta Lime very anxious asking "what cell type is this cancer, what stage, etc."  Ms Hitchens crying over the phone; asking if they (her and their son) can come in to for appt without pt to discuss situation.  With discussion, comfort given and explained that Dr. Truett Perna will be made aware of request and may call to discuss situation.  Robbi Garter cell [phone]

## 2012-10-20 NOTE — Telephone Encounter (Signed)
Spoke with patient and let him know that ENT appt. With Dr. Jearld Fenton had been made for this coming Monday at 10/23/12 at 2:30 pm.  Gave patient address and phone number.  Let him know they requested him to be there 15-20 minutes early and bring a current medication list and insurance card.  He appreciated the call.

## 2012-10-20 NOTE — Telephone Encounter (Signed)
Faxed medical records to Dr Jearld Fenton

## 2012-10-23 ENCOUNTER — Telehealth: Payer: Self-pay | Admitting: *Deleted

## 2012-10-23 ENCOUNTER — Ambulatory Visit
Admission: RE | Admit: 2012-10-23 | Discharge: 2012-10-23 | Disposition: A | Discharge status: Home / Self Care | Payer: Federal, State, Local not specified - PPO | Source: Ambulatory Visit | Attending: Radiation Oncology | Admitting: Radiation Oncology

## 2012-10-23 ENCOUNTER — Encounter: Payer: Self-pay | Admitting: Radiation Oncology

## 2012-10-23 VITALS — BP 128/70 | HR 78 | Temp 97.8°F | Resp 20 | Wt 201.3 lb

## 2012-10-23 DIAGNOSIS — C419 Malignant neoplasm of bone and articular cartilage, unspecified: Secondary | ICD-10-CM

## 2012-10-23 DIAGNOSIS — C7951 Secondary malignant neoplasm of bone: Secondary | ICD-10-CM

## 2012-10-23 MED ORDER — BIAFINE EX EMUL
CUTANEOUS | Status: DC | PRN
  Administered 2012-10-23: 11:00:00 via TOPICAL

## 2012-10-23 MED ORDER — FENTANYL 25 MCG/HR TD PT72: 1.0000 | MEDICATED_PATCH | TRANSDERMAL | Status: DC

## 2012-10-23 NOTE — Progress Notes (Signed)
Patient here weekly rad txs:5/14 completed l-spine, biafine cream given with post sim teaching, patient pain a 6 out of 1-10 scale lower lumbar, reg. Bowel movements,  10:51 AM

## 2012-10-23 NOTE — Progress Notes (Signed)
Weekly Management Note:  Site: Lumbar spine and left iliac bone Current Dose:  1250  cGy Projected Dose: 3500  cGy  Narrative: The patient is seen today for routine under treatment assessment. CBCT/MVCT images/port films were reviewed. The chart was reviewed.   The patient is found to have metastatic carcinoma of thyroid. The patient scheduled see Dr. Suzanna Obey today for consideration of thyroidectomy. He will see Dr. Marikay Alar on December 23 to evaluate stability of his lumbar spine since a prolonged survival is expected. The plan is to proceed with a thyroidectomy and then have him undergo radioactive iodine therapy. We are currently treating his symptomatic disease to his lumbar spine and left iliac bone per NCCN guidelines. He is still quite symptomatic and he clearly has a neuropathy with pain radiating down his left lower extremity. He takes hydrocodone and ibuprofen when necessary.  Physical Examination:  Filed Vitals:   10/23/12 1049  BP: 128/70  Pulse: 78  Temp: 97.8 F (36.6 C)  Resp: 20  .  Weight: 201 lb 4.8 oz (91.309 kg). No change.  Impression: Tolerating radiation therapy well. His pain is not under ideal control, I wonder if he would benefit from gabapentin for probably to be neuropathic pain from his lumbar involvement. I defer to Dr. Truett Perna and Dr. Yetta Barre.  Plan: Continue radiation therapy as planned.

## 2012-10-23 NOTE — Telephone Encounter (Signed)
[employer] delivered to MD desk to dictate letter.

## 2012-10-24 ENCOUNTER — Ambulatory Visit
Admission: RE | Admit: 2012-10-24 | Discharge: 2012-10-24 | Disposition: A | Discharge status: Home / Self Care | Payer: Federal, State, Local not specified - PPO | Source: Ambulatory Visit | Attending: Radiation Oncology | Admitting: Radiation Oncology

## 2012-10-24 ENCOUNTER — Encounter: Payer: Self-pay | Admitting: Oncology

## 2012-10-24 ENCOUNTER — Other Ambulatory Visit: Payer: Self-pay | Admitting: Otolaryngology

## 2012-10-25 ENCOUNTER — Encounter (HOSPITAL_COMMUNITY): Payer: Self-pay | Admitting: Pharmacy Technician

## 2012-10-25 ENCOUNTER — Ambulatory Visit
Admission: RE | Admit: 2012-10-25 | Discharge: 2012-10-25 | Disposition: A | Discharge status: Home / Self Care | Payer: Federal, State, Local not specified - PPO | Source: Ambulatory Visit | Attending: Radiation Oncology | Admitting: Radiation Oncology

## 2012-10-25 ENCOUNTER — Telehealth: Payer: Self-pay | Admitting: *Deleted

## 2012-10-25 NOTE — Telephone Encounter (Signed)
Left message on voicemail in response to call on 10/20/12. Dr. Truett Perna prefers she accompany pt to next visit and he will discuss those things at that time.

## 2012-10-25 NOTE — Telephone Encounter (Signed)
Notified patient that jury duty letter is ready for pick up at front reception desk at [facility].

## 2012-10-26 ENCOUNTER — Ambulatory Visit
Admission: RE | Admit: 2012-10-26 | Discharge: 2012-10-26 | Disposition: A | Discharge status: Home / Self Care | Payer: Federal, State, Local not specified - PPO | Source: Ambulatory Visit | Attending: Radiation Oncology | Admitting: Radiation Oncology

## 2012-10-27 ENCOUNTER — Ambulatory Visit
Admission: RE | Admit: 2012-10-27 | Discharge: 2012-10-27 | Disposition: A | Discharge status: Home / Self Care | Payer: Federal, State, Local not specified - PPO | Source: Ambulatory Visit | Attending: Radiation Oncology | Admitting: Radiation Oncology

## 2012-10-27 ENCOUNTER — Telehealth: Payer: Self-pay | Admitting: *Deleted

## 2012-10-27 NOTE — Telephone Encounter (Signed)
Call from pt requesting itemized statements and CNS 1500 forms for his cancer policy. Gave pt # to billing dept. [phone]) He also has a physician's statement to be filled out by Dr. Truett Perna. He will drop this off on 10/30/12. Pt made aware it may take up to 7 days to get form back. Requested to be called when ready.

## 2012-10-30 ENCOUNTER — Ambulatory Visit
Admission: RE | Admit: 2012-10-30 | Discharge: 2012-10-30 | Disposition: A | Discharge status: Home / Self Care | Payer: Federal, State, Local not specified - PPO | Source: Ambulatory Visit | Attending: Radiation Oncology | Admitting: Radiation Oncology

## 2012-10-30 ENCOUNTER — Encounter: Payer: Self-pay | Admitting: Radiation Oncology

## 2012-10-30 VITALS — BP 137/77 | HR 80 | Temp 98.2°F | Resp 20 | Wt 197.9 lb

## 2012-10-30 DIAGNOSIS — C7952 Secondary malignant neoplasm of bone marrow: Secondary | ICD-10-CM

## 2012-10-30 NOTE — Progress Notes (Signed)
Weekly Management Note:  Site: Lumbar spine and left iliac bone Current Dose:  2500  cGy Projected Dose: 3500  cGy  Narrative: The patient is seen today for routine under treatment assessment. CBCT/MVCT images/port films were reviewed. The chart was reviewed.   He states that his pain may be slightly improved. His pain is worse with sitting and weightbearing. He is scheduled for his thyroidectomy this Thursday with Dr. Jearld Fenton. He will postpone his visit with Dr. Yetta Barre, initially scheduled for December 23. He'll be rested this Wednesday through Friday for his surgery.  Physical Examination:  Filed Vitals:   10/30/12 1055  BP: 137/77  Pulse: 80  Temp: 98.2 F (36.8 C)  Resp: 20  .  Weight: 197 lb 14.4 oz (89.767 kg). No change.  Impression: Tolerating radiation therapy well.  Plan: Continue radiation therapy as planned.

## 2012-10-30 NOTE — Progress Notes (Signed)
Patient here weekly rad txcs, completd to L-spine and L-iliac, 10/14, alert,oriented x3, pain is manageable states patient as long as he doesn't sit for too long, will have pre-op  Wed, for thyroid surgery on Thursday, will have radiation tx tomorrow and then again  Will restart next Monday, Fentanyl patch 25 mcg patch on, he says he isan't sure how much that is helping yet, no nausea, bowels regular 10:54 AM'

## 2012-10-31 ENCOUNTER — Ambulatory Visit
Admission: RE | Admit: 2012-10-31 | Discharge: 2012-10-31 | Disposition: A | Discharge status: Home / Self Care | Payer: Federal, State, Local not specified - PPO | Source: Ambulatory Visit | Attending: Radiation Oncology | Admitting: Radiation Oncology

## 2012-11-01 ENCOUNTER — Encounter (HOSPITAL_COMMUNITY): Payer: Self-pay

## 2012-11-01 ENCOUNTER — Encounter (HOSPITAL_COMMUNITY)
Admission: RE | Admit: 2012-11-01 | Discharge: 2012-11-01 | Disposition: A | Discharge status: Home / Self Care | Payer: Federal, State, Local not specified - PPO | Source: Ambulatory Visit | Attending: Otolaryngology | Admitting: Otolaryngology

## 2012-11-01 ENCOUNTER — Ambulatory Visit: Payer: Federal, State, Local not specified - PPO

## 2012-11-01 HISTORY — DX: Myoneural disorder, unspecified: G70.9

## 2012-11-01 HISTORY — DX: Malignant neoplasm of thyroid gland: C73

## 2012-11-01 LAB — BASIC METABOLIC PANEL
CO2: 26 mEq/L (ref 19–32)
Calcium: 9.5 mg/dL (ref 8.4–10.5)
Chloride: 101 mEq/L (ref 96–112)
Glucose, Bld: 105 mg/dL — ABNORMAL HIGH (ref 70–99)
Potassium: 4.4 mEq/L (ref 3.5–5.1)
Sodium: 137 mEq/L (ref 135–145)

## 2012-11-01 LAB — CBC
Hemoglobin: 13.6 g/dL (ref 13.0–17.0)
MCV: 94.5 fL (ref 78.0–100.0)
Platelets: 190 10*3/uL (ref 150–400)
RBC: 4.22 MIL/uL (ref 4.22–5.81)
WBC: 10.1 10*3/uL (ref 4.0–10.5)

## 2012-11-01 LAB — SURGICAL PCR SCREEN
MRSA, PCR: NEGATIVE
Staphylococcus aureus: NEGATIVE

## 2012-11-01 NOTE — Pre-Procedure Instructions (Signed)
20 Tonie Vizcarrondo  11/01/2012   Your procedure is scheduled on:  11/02/2012  Report to [facility] at 8:00 AM.  Call this number if you have problems the morning of surgery: [phone]   Remember:   Do not eat food or drink liquids :After Midnight. - on Wednesday     Take these medicines the morning of surgery with A SIP OF WATER: alprazolam, Norco   Do not wear jewelry  Do not wear lotions, powders, or perfumes. You may wear deodorant.   Men may shave face and neck.  Do not bring valuables to the hospital.  Contacts, dentures or bridgework may not be worn into surgery.  Leave suitcase in the car. After surgery it may be brought to your room.  For patients admitted to the hospital, checkout time is 11:00 AM the day of discharge.   Patients discharged the day of surgery will not be allowed to drive home.  Name and phone number of your driver: /w Shirlee Limerick  Special Instructions: Shower using CHG 2 nights before surgery and the night before surgery.  If you shower the day of surgery use CHG.  Use special wash - you have one bottle of CHG for all showers.  You should use approximately 1/3 of the bottle for each shower.   Please read over the following fact sheets that you were given: Pain Booklet, Coughing and Deep Breathing, MRSA Information and Surgical Site Infection Prevention

## 2012-11-02 ENCOUNTER — Encounter (HOSPITAL_COMMUNITY): Payer: Self-pay | Admitting: Surgery

## 2012-11-02 ENCOUNTER — Encounter (HOSPITAL_COMMUNITY): Payer: Self-pay | Admitting: Anesthesiology

## 2012-11-02 ENCOUNTER — Ambulatory Visit: Payer: Federal, State, Local not specified - PPO

## 2012-11-02 ENCOUNTER — Observation Stay (HOSPITAL_COMMUNITY)
Admission: RE | Admit: 2012-11-02 | Discharge: 2012-11-03 | DRG: 290 | Disposition: A | Discharge status: Home / Self Care | Payer: Federal, State, Local not specified - PPO | Source: Ambulatory Visit | Attending: Otolaryngology | Admitting: Otolaryngology

## 2012-11-02 ENCOUNTER — Encounter (HOSPITAL_COMMUNITY)
Admission: RE | Disposition: A | Discharge status: Home / Self Care | Payer: Self-pay | Source: Ambulatory Visit | Attending: Otolaryngology

## 2012-11-02 ENCOUNTER — Encounter (HOSPITAL_COMMUNITY): Payer: Self-pay | Admitting: General Practice

## 2012-11-02 ENCOUNTER — Ambulatory Visit (HOSPITAL_COMMUNITY): Payer: Federal, State, Local not specified - PPO | Admitting: Anesthesiology

## 2012-11-02 DIAGNOSIS — C7952 Secondary malignant neoplasm of bone marrow: Secondary | ICD-10-CM | POA: Insufficient documentation

## 2012-11-02 DIAGNOSIS — Z01818 Encounter for other preprocedural examination: Secondary | ICD-10-CM | POA: Insufficient documentation

## 2012-11-02 DIAGNOSIS — C7951 Secondary malignant neoplasm of bone: Secondary | ICD-10-CM | POA: Insufficient documentation

## 2012-11-02 DIAGNOSIS — C73 Malignant neoplasm of thyroid gland: Secondary | ICD-10-CM | POA: Insufficient documentation

## 2012-11-02 DIAGNOSIS — Z01812 Encounter for preprocedural laboratory examination: Secondary | ICD-10-CM | POA: Insufficient documentation

## 2012-11-02 HISTORY — PX: TOTAL THYROIDECTOMY: SHX2547

## 2012-11-02 HISTORY — DX: Low back pain, unspecified: M54.50

## 2012-11-02 HISTORY — DX: Shortness of breath: R06.02

## 2012-11-02 HISTORY — DX: Neuralgia and neuritis, unspecified: M79.2

## 2012-11-02 HISTORY — DX: Other chronic pain: G89.29

## 2012-11-02 HISTORY — DX: Malignant neoplasm of thyroid gland: C73

## 2012-11-02 HISTORY — DX: Low back pain: M54.5

## 2012-11-02 HISTORY — PX: THYROIDECTOMY: SHX17

## 2012-11-02 HISTORY — DX: Adverse effect of unspecified anesthetic, initial encounter: T41.45XA

## 2012-11-02 SURGERY — THYROIDECTOMY
Anesthesia: General | Site: Neck | Laterality: Bilateral | Wound class: Clean

## 2012-11-02 MED ORDER — FENTANYL CITRATE 0.05 MG/ML IJ SOLN
INTRAMUSCULAR | Status: DC | PRN
  Administered 2012-11-02 (×2): 50 ug via INTRAVENOUS
  Administered 2012-11-02: 100 ug via INTRAVENOUS

## 2012-11-02 MED ORDER — HYDROMORPHONE HCL PF 1 MG/ML IJ SOLN
INTRAMUSCULAR | Status: AC
  Filled 2012-11-02: qty 1

## 2012-11-02 MED ORDER — LIDOCAINE-EPINEPHRINE 1 %-1:100000 IJ SOLN
INTRAMUSCULAR | Status: AC
  Filled 2012-11-02: qty 1

## 2012-11-02 MED ORDER — ONDANSETRON HCL 4 MG/2ML IJ SOLN: 4.0000 mg | INTRAMUSCULAR | Status: DC | PRN

## 2012-11-02 MED ORDER — PHENYLEPHRINE HCL 10 MG/ML IJ SOLN
INTRAMUSCULAR | Status: DC | PRN
  Administered 2012-11-02 (×4): 40 ug via INTRAVENOUS

## 2012-11-02 MED ORDER — CALCIUM 1200-1000 MG-UNIT PO CHEW: 1.0000 | CHEWABLE_TABLET | Freq: Every day | ORAL | Status: DC

## 2012-11-02 MED ORDER — HYDROMORPHONE HCL PF 1 MG/ML IJ SOLN
0.2500 mg | INTRAMUSCULAR | Status: DC | PRN
  Administered 2012-11-02 (×3): 0.5 mg via INTRAVENOUS

## 2012-11-02 MED ORDER — SODIUM CHLORIDE 0.9 % IV SOLN
10.0000 mg | INTRAVENOUS | Status: DC | PRN
  Administered 2012-11-02: 25 ug/min via INTRAVENOUS

## 2012-11-02 MED ORDER — LIDOCAINE HCL (CARDIAC) 20 MG/ML IV SOLN
INTRAVENOUS | Status: DC | PRN
  Administered 2012-11-02: 80 mg via INTRAVENOUS

## 2012-11-02 MED ORDER — MORPHINE SULFATE 2 MG/ML IJ SOLN: 2.0000 mg | INTRAMUSCULAR | Status: DC | PRN

## 2012-11-02 MED ORDER — SUCCINYLCHOLINE CHLORIDE 20 MG/ML IJ SOLN
INTRAMUSCULAR | Status: DC | PRN
  Administered 2012-11-02: 100 mg via INTRAVENOUS

## 2012-11-02 MED ORDER — ARTIFICIAL TEARS OP OINT
TOPICAL_OINTMENT | OPHTHALMIC | Status: DC | PRN
  Administered 2012-11-02: 1 via OPHTHALMIC

## 2012-11-02 MED ORDER — DEXAMETHASONE SODIUM PHOSPHATE 4 MG/ML IJ SOLN
INTRAMUSCULAR | Status: DC | PRN
  Administered 2012-11-02: 8 mg via INTRAVENOUS

## 2012-11-02 MED ORDER — CEFAZOLIN SODIUM 1-5 GM-% IV SOLN
INTRAVENOUS | Status: AC
  Filled 2012-11-02: qty 100

## 2012-11-02 MED ORDER — LACTATED RINGERS IV SOLN
INTRAVENOUS | Status: DC
  Administered 2012-11-02: 10:00:00 via INTRAVENOUS

## 2012-11-02 MED ORDER — MIDAZOLAM HCL 5 MG/5ML IJ SOLN
INTRAMUSCULAR | Status: DC | PRN
  Administered 2012-11-02: 2 mg via INTRAVENOUS

## 2012-11-02 MED ORDER — BACITRACIN ZINC 500 UNIT/GM EX OINT
TOPICAL_OINTMENT | CUTANEOUS | Status: AC
  Filled 2012-11-02: qty 15

## 2012-11-02 MED ORDER — ONDANSETRON HCL 4 MG PO TABS: 4.0000 mg | ORAL_TABLET | ORAL | Status: DC | PRN

## 2012-11-02 MED ORDER — DEXTROSE-NACL 5-0.45 % IV SOLN
INTRAVENOUS | Status: DC
  Administered 2012-11-02: 16:00:00 via INTRAVENOUS
  Administered 2012-11-03: 125 mL/h via INTRAVENOUS
  Administered 2012-11-03: 09:00:00 via INTRAVENOUS
  Filled 2012-11-02: qty 1000

## 2012-11-02 MED ORDER — HYDROCODONE-ACETAMINOPHEN 10-325 MG PO TABS
1.0000 | ORAL_TABLET | ORAL | Status: DC | PRN
  Administered 2012-11-02 – 2012-11-03 (×4): 1 via ORAL
  Filled 2012-11-02 (×4): qty 1

## 2012-11-02 MED ORDER — LIDOCAINE HCL 4 % MT SOLN
OROMUCOSAL | Status: DC | PRN
  Administered 2012-11-02: 4 mL via TOPICAL

## 2012-11-02 MED ORDER — PROPOFOL 10 MG/ML IV BOLUS
INTRAVENOUS | Status: DC | PRN
  Administered 2012-11-02: 200 mg via INTRAVENOUS

## 2012-11-02 MED ORDER — 0.9 % SODIUM CHLORIDE (POUR BTL) OPTIME
TOPICAL | Status: DC | PRN
  Administered 2012-11-02: 1000 mL

## 2012-11-02 MED ORDER — ONDANSETRON HCL 4 MG/2ML IJ SOLN
INTRAMUSCULAR | Status: DC | PRN
  Administered 2012-11-02: 4 mg via INTRAVENOUS

## 2012-11-02 MED ORDER — CALCIUM CARBONATE-VITAMIN D 500-200 MG-UNIT PO TABS
2.0000 | ORAL_TABLET | Freq: Every day | ORAL | Status: DC
  Administered 2012-11-02 – 2012-11-03 (×2): 2 via ORAL
  Filled 2012-11-02 (×3): qty 2

## 2012-11-02 MED ORDER — CEFAZOLIN SODIUM 1-5 GM-% IV SOLN
INTRAVENOUS | Status: DC | PRN
  Administered 2012-11-02: 2 g via INTRAVENOUS

## 2012-11-02 MED ORDER — FENTANYL 25 MCG/HR TD PT72
25.0000 ug | MEDICATED_PATCH | TRANSDERMAL | Status: DC
  Filled 2012-11-02: qty 1

## 2012-11-02 MED ORDER — LACTATED RINGERS IV SOLN
INTRAVENOUS | Status: DC | PRN
  Administered 2012-11-02 (×2): via INTRAVENOUS

## 2012-11-02 SURGICAL SUPPLY — 48 items
APPLIER CLIP 9.375 SM OPEN (CLIP) ×4
ATTRACTOMAT 16X20 MAGNETIC DRP (DRAPES) IMPLANT
BLADE SURG 15 STRL LF DISP TIS (BLADE) IMPLANT
BLADE SURG 15 STRL SS (BLADE)
BLADE SURG ROTATE 9660 (MISCELLANEOUS) IMPLANT
CANISTER SUCTION 2500CC (MISCELLANEOUS) ×2 IMPLANT
CLEANER TIP ELECTROSURG 2X2 (MISCELLANEOUS) ×2 IMPLANT
CLIP APPLIE 9.375 SM OPEN (CLIP) ×2 IMPLANT
CLOTH BEACON ORANGE TIMEOUT ST (SAFETY) ×2 IMPLANT
CONT SPEC 4OZ CLIKSEAL STRL BL (MISCELLANEOUS) ×2 IMPLANT
CORDS BIPOLAR (ELECTRODE) ×2 IMPLANT
COVER SURGICAL LIGHT HANDLE (MISCELLANEOUS) ×2 IMPLANT
CRADLE DONUT ADULT HEAD (MISCELLANEOUS) IMPLANT
DERMABOND ADVANCED (GAUZE/BANDAGES/DRESSINGS) ×1
DERMABOND ADVANCED .7 DNX12 (GAUZE/BANDAGES/DRESSINGS) ×1 IMPLANT
DRAIN JACKSON RD 7FR 3/32 (WOUND CARE) ×2 IMPLANT
ELECT COATED BLADE 2.86 ST (ELECTRODE) ×2 IMPLANT
ELECT REM PT RETURN 9FT ADLT (ELECTROSURGICAL) ×2
ELECTRODE REM PT RTRN 9FT ADLT (ELECTROSURGICAL) ×1 IMPLANT
EVACUATOR SILICONE 100CC (DRAIN) ×2 IMPLANT
GAUZE SPONGE 4X4 16PLY XRAY LF (GAUZE/BANDAGES/DRESSINGS) ×2 IMPLANT
GLOVE ECLIPSE 7.5 STRL STRAW (GLOVE) ×2 IMPLANT
GLOVE SURG SS PI 6.0 STRL IVOR (GLOVE) ×4 IMPLANT
GLOVE SURG SS PI 7.5 STRL IVOR (GLOVE) ×4 IMPLANT
GOWN STRL NON-REIN LRG LVL3 (GOWN DISPOSABLE) ×6 IMPLANT
KIT BASIN OR (CUSTOM PROCEDURE TRAY) ×2 IMPLANT
KIT ROOM TURNOVER OR (KITS) ×2 IMPLANT
LOCATOR NERVE 3 VOLT (DISPOSABLE) ×2 IMPLANT
NS IRRIG 1000ML POUR BTL (IV SOLUTION) ×2 IMPLANT
PAD ARMBOARD 7.5X6 YLW CONV (MISCELLANEOUS) ×4 IMPLANT
PENCIL FOOT CONTROL (ELECTRODE) ×2 IMPLANT
SPONGE INTESTINAL PEANUT (DISPOSABLE) ×2 IMPLANT
STAPLER VISISTAT 35W (STAPLE) ×2 IMPLANT
SUT CHROMIC 4 0 PS 2 18 (SUTURE) ×6 IMPLANT
SUT ETHILON 3 0 PS 1 (SUTURE) ×2 IMPLANT
SUT SILK 2 0 (SUTURE) ×2
SUT SILK 2-0 18XBRD TIE 12 (SUTURE) ×2 IMPLANT
SUT SILK 4 0 (SUTURE) ×1
SUT SILK 4-0 18XBRD TIE 12 (SUTURE) ×1 IMPLANT
TOWEL OR 17X24 6PK STRL BLUE (TOWEL DISPOSABLE) ×2 IMPLANT
TOWEL OR 17X26 10 PK STRL BLUE (TOWEL DISPOSABLE) ×2 IMPLANT
TRAY ENT MC OR (CUSTOM PROCEDURE TRAY) ×2 IMPLANT
TRAY FOLEY CATH 14FRSI W/METER (CATHETERS) ×2 IMPLANT
TUBE ENDOTRAC EMG 7X10.2 (MISCELLANEOUS) ×2 IMPLANT
TUBE ENDOTRAC EMG 8X11.3 (MISCELLANEOUS) IMPLANT
TUBE ENDOTRACH  EMG 6MMTUBE EN (MISCELLANEOUS)
TUBE ENDOTRACH EMG 6MMTUBE EN (MISCELLANEOUS) IMPLANT
WATER STERILE IRR 1000ML POUR (IV SOLUTION) ×2 IMPLANT

## 2012-11-02 NOTE — Anesthesia Postprocedure Evaluation (Signed)
  Anesthesia Post-op Note  Patient: Gregory Snow  Procedure(s) Performed: Procedure(s) (LRB) with comments: THYROIDECTOMY (Bilateral)  Patient Location: PACU  Anesthesia Type:General  Level of Consciousness: awake  Airway and Oxygen Therapy: Patient Spontanous Breathing  Post-op Pain: mild  Post-op Assessment: Post-op Vital signs reviewed  Post-op Vital Signs: Reviewed  Complications: No apparent anesthesia complications

## 2012-11-02 NOTE — Preoperative (Signed)
Beta Blockers   Reason not to administer Beta Blockers:Not Applicable, No BB  

## 2012-11-02 NOTE — Progress Notes (Signed)
Day of Surgery  Subjective: Doing well.  Mainly having pain related to his bone metastases.  Objective: Vital signs in last 24 hours: Temp:  [97 F (36.1 C)-98.6 F (37 C)] 98.6 F (37 C) (12/19 1525) Pulse Rate:  [63-78] 65  (12/19 1525) Resp:  [11-18] 18  (12/19 1525) BP: (107-135)/(57-83) 129/66 mmHg (12/19 1525) SpO2:  [95 %-100 %] 99 % (12/19 1525) Weight:  [90.266 kg (199 lb)] 90.266 kg (199 lb) (12/19 1525) Last BM Date: 11/01/12  Intake/Output from previous day:   Intake/Output this shift: Total I/O In: 1750 [I.V.:1750] Out: 1155 [Urine:935; Drains:20; Blood:200]  General appearance: alert, cooperative and no distress Neck: thyroidectomy incision clean and intact.  Drain functioning.  No fluid collection.  Normal voice.  Lab Results:   [facility] 11/01/12 1214  WBC 10.1  HGB 13.6  HCT 39.9  PLT 190   BMET  Basename 11/01/12 1214  NA 137  K 4.4  CL 101  CO2 26  GLUCOSE 105*  BUN 18  CREATININE 0.77  CALCIUM 9.5   PT/INR No results found for this basename: LABPROT:2,INR:2 in the last 72 hours ABG No results found for this basename: PHART:2,PCO2:2,PO2:2,HCO3:2 in the last 72 hours  Studies/Results: Dg Chest 2 View  11/01/2012  *RADIOLOGY REPORT*  Clinical Data: Preop for thyroidectomy  CHEST - 2 VIEW  Comparison: 09/22/2012  Findings: Cardiomediastinal silhouette is unremarkable.  There is right tracheal deviation probable due to the thyroid gland mass. No acute infiltrate or pulmonary edema.  Again noted lytic lesion in the T10 vertebral body posterior aspect measures 2.2 cm.  IMPRESSION: No active disease.  Again noted lytic lesion T10 vertebral body.   Original Report Authenticated By: Natasha Mead, M.D.     Anti-infectives: Anti-infectives    None      Assessment/Plan: s/p Procedure(s) (LRB) with comments: THYROIDECTOMY (Bilateral) Doing well.  Continue drain.  Pain control.  LOS: 0 days    Tamre Cass 11/02/2012

## 2012-11-02 NOTE — Transfer of Care (Signed)
Immediate Anesthesia Transfer of Care Note  Patient: Gregory Snow  Procedure(s) Performed: Procedure(s) (LRB) with comments: THYROIDECTOMY (Bilateral)  Patient Location: PACU  Anesthesia Type:General  Level of Consciousness: awake, alert  and oriented  Airway & Oxygen Therapy: Patient Spontanous Breathing  Post-op Assessment: Report given to PACU RN, Post -op Vital signs reviewed and stable and Patient moving all extremities, tongue midline  Post vital signs: Reviewed and stable  Complications: No apparent anesthesia complications

## 2012-11-02 NOTE — Progress Notes (Signed)
Speech clear

## 2012-11-02 NOTE — H&P (Signed)
Gregory Snow is an 61 y.o. male.   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 He has a large thyroid mass in the left side that is deviating his trachea slightly to the right. He has 2 nodes on the left in one node on the right that are about just less than 2 cm. We have had extensive discussion both in the office and by phone regarding his situation. Recommendation was made for a total thyroidectomy and neck dissection. Patient did not want to proceed with a neck dissection despite the understanding of high likelihood that those nodes have positive cancer. He also refused a CT scan evaluation even with discussion that there may be some impingement or adherence of his tumor to structures. He wants to just have his thyroid removed so he can get the radioactive iodine. I have discussed this case with Dr. Sharl Ma who agrees with neck dissection in best option but does agree that the patient may respond well with the small nodes with the chemotherapy agent/radioactive iodine. The patient's primary problem is the bone metastases. The patient is ready to proceed with total thyroidectomy alone. This procedure was discussed in detail.  Past Medical History  Diagnosis Date  . Insomnia   . 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 Anxiety     new dx  . Panic attacks   . Arthritis     cervical  . DDD (degenerative disc disease), lumbar     hx L-3_4   . Thyroid cancer   . Neuromuscular disorder     tumor- L3- pinched nerve, radiculopathy, on L side - hip, leg     Past Surgical History  Procedure Date  . Adnoids     as a child, left tonsils in    Family History  Problem Relation Age of Onset  . Cancer Brother 66    prostate ca/ surgically    Social History:  reports that he has been smoking Cigarettes.  He has a 15 pack-year smoking history. He has never used smokeless tobacco. He reports that he  drinks alcohol. He reports that he does not use illicit drugs.  Allergies: No Known Allergies  Medications Prior to Admission  Medication Sig Dispense Refill  . ALPRAZolam (XANAX) 0.5 MG tablet Take 0.25 mg by mouth 2 (two) times daily as needed. For anxiety.      . Calcium 1200-1000 MG-UNIT CHEW Chew 1 tablet by mouth daily.      Marland Kitchen emollient (BIAFINE) cream Apply 1 application topically daily. Apply as a burn preventative during radiation therapy.      . fentaNYL (DURAGESIC - DOSED MCG/HR) 25 MCG/HR Place 1 patch (25 mcg total) onto the skin every 3 (three) days.  5 patch  0  . Glucos-Chond-Sterol-Fish Oil (GLUCOSAMINE CHONDROITIN PLUS PO) Take 2 tablets by mouth daily.      Marland Kitchen HYDROcodone-acetaminophen (NORCO) 10-325 MG per tablet Take 1 tablet by mouth every 6 (six) hours as needed. For pain.      Marland Kitchen ibuprofen (ADVIL,MOTRIN) 200 MG tablet Take 400 mg by mouth every 6 (six) hours as needed. For pain.      . Ibuprofen-Diphenhydramine Cit (RA IBUPROFEN PM PO) Take 2 capsules by mouth at bedtime.      . Melatonin 3 MG CAPS Take 3 mg by mouth at bedtime as needed. For sleep.      . Misc  Natural Products (GINSENG COMPLEX PO) Take 1 capsule by mouth daily.      . Omega-3 Fatty Acids (FISH OIL PO) Take 1 capsule by mouth daily.      . vitamin C (ASCORBIC ACID) 500 MG tablet Take 500 mg by mouth daily.        Results for orders placed during the hospital encounter of 11/01/12 (from the past 48 hour(s))  SURGICAL PCR SCREEN     Status: Normal   Collection Time   11/01/12 12:14 PM      Component Value Range Comment   MRSA, PCR NEGATIVE  NEGATIVE    Staphylococcus aureus NEGATIVE  NEGATIVE   BASIC METABOLIC PANEL     Status: Abnormal   Collection Time   11/01/12 12:14 PM      Component Value Range Comment   Sodium 137  135 - 145 mEq/L    Potassium 4.4  3.5 - 5.1 mEq/L    Chloride 101  96 - 112 mEq/L    CO2 26  19 - 32 mEq/L    Glucose, Bld 105 (*) 70 - 99 mg/dL    BUN 18  6 - 23 mg/dL     Creatinine, Ser 1.61  0.50 - 1.35 mg/dL    Calcium 9.5  8.4 - 09.6 mg/dL    GFR calc non Af Amer >90  >90 mL/min    GFR calc Af Amer >90  >90 mL/min   CBC     Status: Normal   Collection Time   11/01/12 12:14 PM      Component Value Range Comment   WBC 10.1  4.0 - 10.5 K/uL    RBC 4.22  4.22 - 5.81 MIL/uL    Hemoglobin 13.6  13.0 - 17.0 g/dL    HCT 04.5  40.9 - 81.1 %    MCV 94.5  78.0 - 100.0 fL    MCH 32.2  26.0 - 34.0 pg    MCHC 34.1  30.0 - 36.0 g/dL    RDW 91.4  78.2 - 95.6 %    Platelets 190  150 - 400 K/uL    Dg Chest 2 View  11/01/2012  *RADIOLOGY REPORT*  Clinical Data: Preop for thyroidectomy  CHEST - 2 VIEW  Comparison: 09/22/2012  Findings: Cardiomediastinal silhouette is unremarkable.  There is right tracheal deviation probable due to the thyroid gland mass. No acute infiltrate or pulmonary edema.  Again noted lytic lesion in the T10 vertebral body posterior aspect measures 2.2 cm.  IMPRESSION: No active disease.  Again noted lytic lesion T10 vertebral body.   Original Report Authenticated By: Natasha Mead, M.D.     Review of Systems  Constitutional: Negative.   HENT: Negative.   Eyes: Negative.   Skin: Negative.     Blood pressure 122/72, pulse 78, temperature 98.3 F (36.8 C), temperature source Oral, resp. rate 18, SpO2 97.00%. Physical Exam  Constitutional: He appears well-developed.  HENT:  Nose: Nose normal.  Mouth/Throat: Oropharynx is clear and moist.  Eyes: Pupils are equal, round, and reactive to light.  Neck: Neck supple. Tracheal deviation present. Thyromegaly present.  Cardiovascular: Normal rate.   Respiratory: Effort normal.     Assessment/Plan Thyroid carcinoma-the patient has been prepared for total thyroidectomy without neck dissection. He is ready to proceed.  Suzanna Obey 11/02/2012, 9:41 AM

## 2012-11-02 NOTE — Progress Notes (Signed)
Arrived from pacu,a/ox4,moved self from stretcher to bed without difficulty, denies nausea/pain. Oriented to room and surroundings

## 2012-11-02 NOTE — Anesthesia Preprocedure Evaluation (Addendum)
Anesthesia Evaluation  Patient identified by MRN, date of birth, ID band Patient awake    Reviewed: Allergy & Precautions, H&P , NPO status , Patient's Chart, lab work & pertinent test results  Airway Mallampati: II      Dental   Pulmonary Current Smoker,  breath sounds clear to auscultation        Cardiovascular negative cardio ROS  Rhythm:Regular Rate:Normal     Neuro/Psych Anxiety    GI/Hepatic negative GI ROS, Neg liver ROS,   Endo/Other  negative endocrine ROS  Renal/GU negative Renal ROS     Musculoskeletal   Abdominal   Peds  Hematology   Anesthesia Other Findings Patient with acute pain to left leg from lumbar radiculopathy- took pain meds this morning.    Reproductive/Obstetrics                         Anesthesia Physical Anesthesia Plan  ASA: II  Anesthesia Plan: General   Post-op Pain Management:    Induction: Intravenous  Airway Management Planned: Oral ETT  Additional Equipment:   Intra-op Plan:   Post-operative Plan:   Informed Consent:   Dental advisory given  Plan Discussed with: CRNA and Anesthesiologist  Anesthesia Plan Comments:         Anesthesia Quick Evaluation

## 2012-11-02 NOTE — Op Note (Signed)
Preop/postop diagnosis: Left thyroid carcinoma Procedure: Total thyroidectomy Anesthesia: Gen. Estimated blood loss: Approximately 100 cc Assistant: Dr. Melvenia Beam Indications: 61 year old with a diagnosis of well-differentiated carcinoma of the thyroid with metastatic lytic lesions to his bone. We have discussed this situation which is difficult given the extensive metastases. He has nodal disease in his neck by ultrasound. A discussion was made regarding a CT scan to further evaluate the nodes as well as the tumor which he refused. He also was not interested in removing the lymph nodes with a neck dissection. He understands neck dissection could be necessary later if the neck disease persists or worsens. He will require further therapy with either chemotherapy and/or radiation. He also most likely will get radioactive iodine. Discussed the total thyroidectomy. He understands the risks, benefits, and options and all his questions were answered and consent was obtained. Operation: Patient was taken to the operating room placed in the supine position after general endotracheal tube anesthesia with the Nims vocal cord monitor he was prepped and draped in usual sterile manner. The mass was obvious by examination. The incision was made in the inferior portion of the neck in a curvilinear fashion about 2 finger breaths above the sternal notch. The skin was opened electrocautery in a superior and inferior subplatysmal flap was elevated. The retractor was positioned. The midline was dissected which was deviated to the right. Strap muscle division was opened. Trachea was clearly deviated to the right. Mass was easily palpated and with careful dissection along its capsule it was dissected laterally. The tumor seemed to be mobile by dissection and in fact except for a small area on her leg which was carefully layer by layer dissected the mass seemed to finger dissected nicely with dividing the cotton  tissue with  electrocautery. Once down to the inferior artery was identified and clamped and divided there was an inferior parathyroid gland that was dissected and left in position. A dissection was carried along the capsule genu and to elevate anteriorly. The dissection seemed to calm after obtaining the mass up to the there is ligament region meaning that the nerve was down in the soft tissue. Superior vasculature was dissected and also it appeared there was a parathyroid gland dissected off the tumor mass. The hemostat was used to open a few areas and the simulator was used to stimulate the area where Berry's ligament was but the nerve was not in that location and it was not further dissected as the tumor was already mobilized. The Berry's ligament was freed and the tumor was brought to midline. Dissection was then carried out in the right side with following the capsule the thyroid. The thyroid was very small on the right side. It was dissected layer by layer with again moving the gland up anteriorly again the soft tissue stayed down and the land easily dissected up toward midline. Again dissection was made in the area of typical nerve entrance and also stimulated with the stimulating Nims probe and it seemed to be lateral to this area. The vasculature was clamped and divided and there was an inferior parathyroid gland that was dissected and preserved. The gland was brought off Berry's ligament and the entire gland was sent for pathology. The wound was irrigated with saline and there was good hemostasis. The #7 drain was placed and secured with a 3-0 nylon. The subcutaneous tissue closed with interrupted 4-0 chromic and the skin with Dermabond. Patient was awake and brought to cover stable condition counts correct.

## 2012-11-03 ENCOUNTER — Ambulatory Visit: Payer: Federal, State, Local not specified - PPO

## 2012-11-03 ENCOUNTER — Ambulatory Visit: Payer: Federal, State, Local not specified - PPO | Admitting: Nurse Practitioner

## 2012-11-03 ENCOUNTER — Other Ambulatory Visit: Payer: Self-pay | Admitting: *Deleted

## 2012-11-03 LAB — CALCIUM: Calcium: 9 mg/dL (ref 8.4–10.5)

## 2012-11-03 MED ORDER — LEVOTHYROXINE SODIUM 100 MCG PO TABS: 100.0000 ug | ORAL_TABLET | Freq: Every day | ORAL | Status: DC

## 2012-11-03 NOTE — Progress Notes (Signed)
1 Day Post-Op  Subjective: Is doing well. Really has no complaints except his back continued to hurt. His voice is normal. He is having no twitching or muscle spasms. His calcium was 9.4 yesterday and 9.0 this morning.  Objective: Vital signs in last 24 hours: Temp:  [97 F (36.1 C)-98.8 F (37.1 C)] 97.9 F (36.6 C) (12/20 0506) Pulse Rate:  [58-73] 58  (12/20 0506) Resp:  [11-20] 18  (12/20 0506) BP: (102-135)/(54-83) 102/56 mmHg (12/20 0506) SpO2:  [95 %-100 %] 100 % (12/20 0506) Weight:  [90.266 kg (199 lb)] 90.266 kg (199 lb) (12/19 1525) Last BM Date: 11/01/12  Intake/Output from previous day: 12/19 0701 - 12/20 0700 In: 4208 [P.O.:480; I.V.:3728] Out: 3190 [Urine:2935; Drains:55; Blood:200] Intake/Output this shift:    Awake and alert, no muscle twitching or issues. His voice is strong and normal. Oral cavity/oropharynx there is no lesions Neck wound is excellent drain was removed without difficulty. There is no evidence of infection or swelling.  Lab Results:   [facility] 11/01/12 1214  WBC 10.1  HGB 13.6  HCT 39.9  PLT 190   BMET  Basename 11/03/12 0610 11/02/12 1954 11/01/12 1214  NA -- -- 137  K -- -- 4.4  CL -- -- 101  CO2 -- -- 26  GLUCOSE -- -- 105*  BUN -- -- 18  CREATININE -- -- 0.77  CALCIUM 9.0 9.4 --   PT/INR No results found for this basename: LABPROT:2,INR:2 in the last 72 hours ABG No results found for this basename: PHART:2,PCO2:2,PO2:2,HCO3:2 in the last 72 hours  Studies/Results: Dg Chest 2 View  11/01/2012  *RADIOLOGY REPORT*  Clinical Data: Preop for thyroidectomy  CHEST - 2 VIEW  Comparison: 09/22/2012  Findings: Cardiomediastinal silhouette is unremarkable.  There is right tracheal deviation probable due to the thyroid gland mass. No acute infiltrate or pulmonary edema.  Again noted lytic lesion in the T10 vertebral body posterior aspect measures 2.2 cm.  IMPRESSION: No active disease.  Again noted lytic lesion T10 vertebral body.    Original Report Authenticated By: Natasha Mead, M.D.     Anti-infectives: Anti-infectives    None      Assessment/Plan: s/p Procedure(s) (LRB) with comments: THYROIDECTOMY (Bilateral) Discharge-he is doing very well. Calcium is within normal range with no significant drop from preop. His voice is normal. He will be placed on Synthroid and he already takes calcium for other indications and he will continue his outpatient medications. He will followup in one week. We discussed wound infection indicators and calcium problem warning signs.  LOS: 1 day    Suzanna Obey 11/03/2012

## 2012-11-03 NOTE — Discharge Summary (Signed)
Physician Discharge Summary  Patient ID: Gregory Snow MRN: [id number] DOB/AGE: November 21, 1950 61 y.o.  Admit date: 11/02/2012 Discharge date: 11/03/2012  Admission Diagnoses: Thyroid carcinoma Discharge Diagnoses: Same Active Problems:  * No active hospital problems. *    Discharged Condition: good  Hospital Course: Patient was admitted to the floor after undergoing total thyroidectomy. He did well through the night with no calcium issues with a 9.0 calcium. He has normal voice. His wound was excellent with no evidence of infection. The drain was removed without difficulty. The patient was ready to be discharged and discharge instructions were given  Consults: None  Significant Diagnostic Studies: labs:  calcium  Treatments: surgery: As above with total thyroidectomy  Discharge Exam: Blood pressure 102/56, pulse 58, temperature 97.9 F (36.6 C), temperature source Oral, resp. rate 18, height 6\' 4"  (1.93 m), weight 90.266 kg (199 lb), SpO2 100.00%. Awake and alert, in no twitching or muscle spasms, voice is normal, wound is excellent with no evidence of infection or swelling. Heart is regular, lungs are clear, extremities no swelling, erythema, or pain  Disposition: Final discharge disposition not confirmed  Discharge Orders    Future Appointments: Provider: Department: Dept Phone: Center:   11/06/2012 11:40 AM [facility] [phone] None   11/07/2012 10:20 AM [facility] [phone] None   11/09/2012 10:20 AM [facility] [phone] None       Medication List     As of 11/03/2012  9:30 AM    ASK your doctor about these medications         ALPRAZolam 0.5 MG tablet   Commonly known as: XANAX   Take 0.25 mg by mouth 2 (two) times daily as needed. For anxiety.      Calcium 1200-1000 MG-UNIT Chew   Chew 1 tablet  by mouth daily.      emollient cream   Commonly known as: BIAFINE   Apply 1 application topically daily. Apply as a burn preventative during radiation therapy.      fentaNYL 25 MCG/HR   Commonly known as: DURAGESIC - dosed mcg/hr   Place 1 patch (25 mcg total) onto the skin every 3 (three) days.      FISH OIL PO   Take 1 capsule by mouth daily.      GINSENG COMPLEX PO   Take 1 capsule by mouth daily.      GLUCOSAMINE CHONDROITIN PLUS PO   Take 2 tablets by mouth daily.      HYDROcodone-acetaminophen 10-325 MG per tablet   Commonly known as: NORCO   Take 1 tablet by mouth every 6 (six) hours as needed. For pain.      ibuprofen 200 MG tablet   Commonly known as: ADVIL,MOTRIN   Take 400 mg by mouth every 6 (six) hours as needed. For pain.      Melatonin 3 MG Caps   Take 3 mg by mouth at bedtime as needed. For sleep.      RA IBUPROFEN PM PO   Take 2 capsules by mouth at bedtime.      vitamin C 500 MG tablet   Commonly known as: ASCORBIC ACID   Take 500 mg by mouth daily.         SignedSuzanna Obey 11/03/2012, 9:30 AM

## 2012-11-03 NOTE — Progress Notes (Signed)
DC home with wife, verbally understood dc instructions, no questions ask, pt insisted on walking to car at DC.

## 2012-11-06 ENCOUNTER — Ambulatory Visit
Admission: RE | Admit: 2012-11-06 | Discharge: 2012-11-06 | Disposition: A | Discharge status: Home / Self Care | Payer: Federal, State, Local not specified - PPO | Source: Ambulatory Visit | Attending: Radiation Oncology | Admitting: Radiation Oncology

## 2012-11-06 ENCOUNTER — Ambulatory Visit
Admission: RE | Admit: 2012-11-06 | Payer: Federal, State, Local not specified - PPO | Source: Ambulatory Visit | Admitting: Radiation Oncology

## 2012-11-06 ENCOUNTER — Telehealth: Payer: Self-pay | Admitting: Oncology

## 2012-11-06 ENCOUNTER — Encounter (HOSPITAL_COMMUNITY): Payer: Self-pay | Admitting: Otolaryngology

## 2012-11-06 NOTE — Telephone Encounter (Signed)
Pt wants to come around Radiation time, nurse notified she will get back with regarding time and date

## 2012-11-06 NOTE — Telephone Encounter (Signed)
Pt called and wants appt around his radiation appt

## 2012-11-07 ENCOUNTER — Ambulatory Visit
Admission: RE | Admit: 2012-11-07 | Discharge: 2012-11-07 | Disposition: A | Discharge status: Home / Self Care | Payer: Federal, State, Local not specified - PPO | Source: Ambulatory Visit | Attending: Radiation Oncology | Admitting: Radiation Oncology

## 2012-11-07 ENCOUNTER — Encounter: Payer: Self-pay | Admitting: Oncology

## 2012-11-07 NOTE — Progress Notes (Signed)
Mailed completed Cancer Claim form to Mr. Schaafsma today. Forward a copy to Medical Records to attach to file.

## 2012-11-09 ENCOUNTER — Ambulatory Visit
Admission: RE | Admit: 2012-11-09 | Discharge: 2012-11-09 | Disposition: A | Discharge status: Home / Self Care | Payer: Federal, State, Local not specified - PPO | Source: Ambulatory Visit | Attending: Radiation Oncology | Admitting: Radiation Oncology

## 2012-11-09 ENCOUNTER — Inpatient Hospital Stay
Admission: RE | Admit: 2012-11-09 | Discharge: 2012-11-09 | Payer: Federal, State, Local not specified - PPO | Source: Ambulatory Visit | Attending: Radiation Oncology | Admitting: Radiation Oncology

## 2012-11-09 ENCOUNTER — Encounter: Payer: Self-pay | Admitting: Radiation Oncology

## 2012-11-09 ENCOUNTER — Other Ambulatory Visit: Payer: Self-pay | Admitting: *Deleted

## 2012-11-09 ENCOUNTER — Telehealth: Payer: Self-pay | Admitting: Oncology

## 2012-11-09 DIAGNOSIS — C7952 Secondary malignant neoplasm of bone marrow: Secondary | ICD-10-CM

## 2012-11-09 NOTE — Progress Notes (Signed)
Patient here weekly rad txs, last one today 14/14, pain in back a 3 on 1-10 scale with pain medication on board, patient has had a total thyroidectomy 11/03/12 with Dr.Byers , incision healing on neck, 11:07 AM

## 2012-11-09 NOTE — Telephone Encounter (Signed)
Talked to patient and he will be here on 11/10/12 MD visit

## 2012-11-09 NOTE — Progress Notes (Signed)
[facility]    Radiation Oncology [address] [place]     Maryln Gottron, M.D. [place], [place] [address]               Billie Lade, M.D., Ph.D. Phone: [phone]      Molli Hazard A. Kathrynn Running, M.D. Fax: [phone]      Radene Gunning, M.D., Ph.D.         Lurline Hare, M.D.         Grayland Jack, M.D Weekly Treatment Management Note  Name: Gregory Snow     MRN: [id number]        CSN: [id number] Date: 11/09/2012      DOB: 1951-03-06  CC: No primary provider on file.         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 The chart was checked and port films  were reviewed. He completes his radiation therapy today.  He denies any problems with nausea or diarrhea. His pain has shown little improvement thus far.  Review of patient's allergies indicates no known allergies.  Current Outpatient Prescriptions  Medication Sig Dispense Refill  . ALPRAZolam (XANAX) 0.5 MG tablet Take 0.25 mg by mouth 2 (two) times daily as needed. For anxiety.      . Calcium 1200-1000 MG-UNIT CHEW Chew 1 tablet by mouth daily.      Marland Kitchen emollient (BIAFINE) cream Apply 1 application topically daily. Apply as a burn preventative during radiation therapy.      . fentaNYL (DURAGESIC - DOSED MCG/HR) 25 MCG/HR Place 1 patch (25 mcg total) onto the skin every 3 (three) days.  5 patch  0  . Glucos-Chond-Sterol-Fish Oil (GLUCOSAMINE CHONDROITIN PLUS PO) Take 2 tablets by mouth daily.      Marland Kitchen HYDROcodone-acetaminophen (NORCO) 10-325 MG per tablet Take 1 tablet by mouth every 6 (six) hours as needed. For pain.      Marland Kitchen ibuprofen (ADVIL,MOTRIN) 200 MG tablet Take 400 mg by mouth every 6 (six) hours as needed. For pain.      .  Ibuprofen-Diphenhydramine Cit (RA IBUPROFEN PM PO) Take 2 capsules by mouth at bedtime.      Marland Kitchen levothyroxine (SYNTHROID) 100 MCG tablet Take 1 tablet (100 mcg total) by mouth daily.  30 tablet  2  . Melatonin 3 MG CAPS Take 3 mg by mouth at bedtime as needed. For sleep.      . Misc Natural Products (GINSENG COMPLEX PO) Take 1 capsule by mouth daily.      . Omega-3 Fatty Acids (FISH OIL PO) Take 1 capsule by mouth daily.      . vitamin C (ASCORBIC ACID) 500 MG tablet Take 500 mg by mouth daily.       Labs:  Lab Results  Component Value Date   WBC 10.1 11/01/2012   HGB 13.6 11/01/2012   HCT 39.9 11/01/2012   MCV 94.5 11/01/2012   PLT 190 11/01/2012   Lab Results  Component Value Date   CREATININE 0.77 11/01/2012   BUN 18 11/01/2012   NA 137 11/01/2012   K 4.4 11/01/2012   CL 101 11/01/2012   CO2 26 11/01/2012   Lab Results  Component Value Date   ALT 16 10/11/2012   AST 13  10/11/2012   BILITOT 0.91 10/11/2012    Physical Examination:  vitals were not taken for this visit.   Wt Readings from Last 3 Encounters:  11/09/12 199 lb 11.2 oz (90.583 kg)  11/02/12 199 lb (90.266 kg)  11/02/12 199 lb (90.266 kg)     Lungs - Normal respiratory effort, chest expands symmetrically. Lungs are clear to auscultation, no crackles or wheezes.  Heart has regular rhythm and rate  Abdomen is soft and non tender with normal bowel sounds  Assessment:  Patient tolerated treatments well  Plan: Routine followup in one month with Dr. Dayton Scrape

## 2012-11-09 NOTE — Progress Notes (Signed)
Per Dr. Truett Perna, schedule patient to see him on 12/27 at 3pm.

## 2012-11-10 ENCOUNTER — Telehealth: Payer: Self-pay | Admitting: Oncology

## 2012-11-10 ENCOUNTER — Ambulatory Visit (HOSPITAL_BASED_OUTPATIENT_CLINIC_OR_DEPARTMENT_OTHER): Payer: Federal, State, Local not specified - PPO | Admitting: Oncology

## 2012-11-10 VITALS — BP 136/68 | HR 80 | Temp 98.2°F | Resp 20 | Ht 76.0 in | Wt 197.0 lb

## 2012-11-10 DIAGNOSIS — C73 Malignant neoplasm of thyroid gland: Secondary | ICD-10-CM

## 2012-11-10 DIAGNOSIS — IMO0002 Reserved for concepts with insufficient information to code with codable children: Secondary | ICD-10-CM

## 2012-11-10 DIAGNOSIS — M899 Disorder of bone, unspecified: Secondary | ICD-10-CM

## 2012-11-10 DIAGNOSIS — F17200 Nicotine dependence, unspecified, uncomplicated: Secondary | ICD-10-CM

## 2012-11-10 DIAGNOSIS — M792 Neuralgia and neuritis, unspecified: Secondary | ICD-10-CM

## 2012-11-10 MED ORDER — AMITRIPTYLINE HCL 25 MG PO TABS: 25.0000 mg | ORAL_TABLET | Freq: Every day | ORAL | Status: DC

## 2012-11-10 NOTE — Telephone Encounter (Signed)
Called and advised Gregory Snow of this referral..Marland KitchenMarland KitchenAudrea Muscat referral info for medical records to send... gv pt appt schedule for Jan 2014

## 2012-11-10 NOTE — Progress Notes (Signed)
   [facility]    OFFICE PROGRESS NOTE   INTERVAL HISTORY:   He returns as scheduled. He underwent a total thyroidectomy on 11/02/2012. He has recovered from the surgery uneventfully. He was placed on thyroid hormone replacement by Dr. Jearld Fenton. He did not undergo a lymph node dissection. 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 Lymphovascular invasion was identified. No extrathyroid tumor extension was identified.  He completed radiation to the spine and pelvis on 11/09/2012. He reports mild improvement in the left leg pain. He continues to have pain in the left lower leg and he request a medication for "radicular pain ". He took a Duragesic patch for 2 doses and this was discontinued. He continues hydrocodone as needed. No difficulty with bowel or bladder control. He had constipation after surgery.  Objective:  Vital signs in last 24 hours:  Blood pressure 136/68, pulse 80, temperature 98.2 F (36.8 C), temperature source Oral, resp. rate 20, height 6\' 4"  (1.93 m), weight 197 lb (89.359 kg).    HEENT: Healed surgical incision at the low neck. Neck without mass Resp:  Lungs clear bilaterally Cardio: Regular rate and rhythm GI: No hepatosplenomegaly Vascular: No leg edema Musculoskeletal: Mild tenderness at the low back.    Lab Results:  Lab Results  Component Value Date   WBC 10.1 11/01/2012   HGB 13.6 11/01/2012   HCT 39.9 11/01/2012   MCV 94.5 11/01/2012   PLT 190 11/01/2012   Thyroglobulin on 10/11/2012-11,514.0   Medications: I have reviewed the patient's current medications.  Assessment/Plan: 1.Metastatic thyroid cancer, palpable left thyroid mass confirmed on a CT 09/22/2012         -Multiple destructive bone lesions including a large lesion at L3 with a soft-tissue component encroaching on the thecal sac             -status post a total thyroidectomy on 11/02/2012 with the pathology confirming a  papillary thyroid carcinoma-follicular variant                                       -markedly elevated thyroglobulin level 2. Pain secondary to the L3 mass with compression of the left L3 nerve root. The pain is also also related to the left iliac mass . He is status post palliative radiation to the lumbar spine and left iliac bone, completed 11/09/2012. 3. Tobacco use    Disposition:  He has completed palliative radiation and underwent a total thyroidectomy on 11/02/2012. He was placed on thyroid hormone.  I will refer Mr.Macbride to Dr. Sharl Ma for management of the thyroid hormone replacement therapy and to direct I-131 treatment.  Mr. Ky requested a medication for the radicular pain. He will begin Elavil at a dose of 25 mg each night. He will contact us if this does not help the pain. He will return for an office visit here in one month.  I will discuss the case with Dr.Kerr. I will see Mr. Ken sooner and coordinate the I-131 therapy if recommended by Dr.Kerr.   Thornton Papas, MD  11/10/2012  4:10 PM

## 2012-11-11 ENCOUNTER — Encounter: Payer: Self-pay | Admitting: Radiation Oncology

## 2012-11-11 NOTE — Progress Notes (Signed)
[facility] Radiation Oncology End of Treatment Note  Name:Custer Cumbie  Date: 11/11/2012 WUJ:[id number] DOB:August 16, 1951   Status:outpatient    CC:  Dr. Mancel Bale, Dr. Marikay Alar, Dr. Suzanna Obey  REFERRING PHYSICIAN: Dr. 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 Jearld Fenton.                           PLAN: Routine followup in one month. Patient instructed to call if questions or worsening complaints in interim. He is to see Dr. Yetta Barre regarding the structural stability of his lumbar spine knowing that he has an extended survival. He'll maintain his followup with Dr. Truett Perna and be scheduled for radioactive iodine therapy.

## 2012-11-14 ENCOUNTER — Telehealth: Payer: Self-pay | Admitting: *Deleted

## 2012-11-14 NOTE — Addendum Note (Signed)
Addended by: Wandalee Ferdinand on: 11/14/2012 01:59 PM   Modules accepted: Orders

## 2012-11-14 NOTE — Telephone Encounter (Signed)
Dr. Truett Perna requests RN call Dr. Daune Perch RN to ask if Dr. Sharl Ma is OK directing I-131 therapy and managing Synthroid ?  Was directed to leave voice mail for his medical assistant. Message left.

## 2012-11-16 ENCOUNTER — Telehealth: Payer: Self-pay | Admitting: *Deleted

## 2012-11-16 NOTE — Telephone Encounter (Signed)
Received call from Dr. Daune Perch office stating they will follow pt regarding directing I-131 therapy and managing Synthroid.

## 2012-11-17 ENCOUNTER — Other Ambulatory Visit: Payer: Self-pay | Admitting: Internal Medicine

## 2012-11-17 DIAGNOSIS — C73 Malignant neoplasm of thyroid gland: Secondary | ICD-10-CM

## 2012-11-23 ENCOUNTER — Telehealth: Payer: Self-pay | Admitting: *Deleted

## 2012-11-23 MED ORDER — HYDROCODONE-ACETAMINOPHEN 10-325 MG PO TABS: 1.0000 | ORAL_TABLET | Freq: Four times a day (QID) | ORAL | Status: DC | PRN

## 2012-11-23 NOTE — Telephone Encounter (Signed)
Call from pt reporting "flare up" of back and hip pain since 11/20/12. Currently taking Hydrocodone 5/325 1 tab Q 6 hours as needed. Requesting increase in dosage. Reviewed with Dr. Truett Perna: OK to call in Hydrocodone 10/325. Same done. Pt reports no change in the "pinched nerve feeling" in legs. Bowels and bladder moving.

## 2012-12-04 ENCOUNTER — Encounter (HOSPITAL_COMMUNITY): Payer: Federal, State, Local not specified - PPO

## 2012-12-07 ENCOUNTER — Ambulatory Visit (HOSPITAL_COMMUNITY)
Admission: RE | Admit: 2012-12-07 | Discharge: 2012-12-07 | Disposition: A | Discharge status: Home / Self Care | Payer: Federal, State, Local not specified - PPO | Source: Ambulatory Visit | Attending: Internal Medicine | Admitting: Internal Medicine

## 2012-12-07 DIAGNOSIS — C73 Malignant neoplasm of thyroid gland: Secondary | ICD-10-CM

## 2012-12-07 DIAGNOSIS — C7951 Secondary malignant neoplasm of bone: Secondary | ICD-10-CM | POA: Insufficient documentation

## 2012-12-07 DIAGNOSIS — C7952 Secondary malignant neoplasm of bone marrow: Secondary | ICD-10-CM | POA: Insufficient documentation

## 2012-12-07 MED ORDER — SODIUM IODIDE I 131 CAPSULE
207.0000 | Freq: Once | INTRAVENOUS | Status: AC | PRN
  Administered 2012-12-07: 207 via ORAL

## 2012-12-08 ENCOUNTER — Encounter: Payer: Self-pay | Admitting: Radiation Oncology

## 2012-12-08 DIAGNOSIS — G709 Myoneural disorder, unspecified: Secondary | ICD-10-CM | POA: Insufficient documentation

## 2012-12-08 DIAGNOSIS — G8929 Other chronic pain: Secondary | ICD-10-CM | POA: Insufficient documentation

## 2012-12-08 DIAGNOSIS — M545 Low back pain: Secondary | ICD-10-CM

## 2012-12-08 DIAGNOSIS — Z923 Personal history of irradiation: Secondary | ICD-10-CM | POA: Insufficient documentation

## 2012-12-08 DIAGNOSIS — M792 Neuralgia and neuritis, unspecified: Secondary | ICD-10-CM | POA: Insufficient documentation

## 2012-12-08 DIAGNOSIS — G4700 Insomnia, unspecified: Secondary | ICD-10-CM | POA: Insufficient documentation

## 2012-12-11 ENCOUNTER — Telehealth: Payer: Self-pay | Admitting: *Deleted

## 2012-12-11 ENCOUNTER — Ambulatory Visit: Payer: Federal, State, Local not specified - PPO | Admitting: Oncology

## 2012-12-11 NOTE — Telephone Encounter (Signed)
Patient called to cancel his appt with Dr. Dayton Scrape. I have transferred the voicemail downstairs. JMW

## 2012-12-12 ENCOUNTER — Ambulatory Visit: Payer: Federal, State, Local not specified - PPO | Admitting: Oncology

## 2012-12-12 ENCOUNTER — Ambulatory Visit: Payer: Federal, State, Local not specified - PPO | Admitting: Radiation Oncology

## 2012-12-14 ENCOUNTER — Encounter (HOSPITAL_COMMUNITY): Payer: Federal, State, Local not specified - PPO

## 2012-12-18 ENCOUNTER — Encounter (HOSPITAL_COMMUNITY)
Admission: RE | Admit: 2012-12-18 | Discharge: 2012-12-18 | Disposition: A | Discharge status: Home / Self Care | Payer: Federal, State, Local not specified - PPO | Source: Ambulatory Visit | Attending: Internal Medicine | Admitting: Internal Medicine

## 2012-12-18 DIAGNOSIS — C73 Malignant neoplasm of thyroid gland: Secondary | ICD-10-CM

## 2013-01-02 ENCOUNTER — Telehealth: Payer: Self-pay | Admitting: *Deleted

## 2013-01-02 NOTE — Telephone Encounter (Signed)
Patient has exactly 7 amitriptyline pills, will break  in half and take for 2 weeks.

## 2013-07-31 ENCOUNTER — Other Ambulatory Visit (HOSPITAL_COMMUNITY): Payer: Self-pay | Admitting: Internal Medicine

## 2013-07-31 DIAGNOSIS — C73 Malignant neoplasm of thyroid gland: Secondary | ICD-10-CM

## 2013-09-19 ENCOUNTER — Ambulatory Visit (HOSPITAL_COMMUNITY): Payer: Federal, State, Local not specified - PPO

## 2013-09-28 ENCOUNTER — Encounter (HOSPITAL_COMMUNITY): Admission: RE | Admit: 2013-09-28 | Payer: Federal, State, Local not specified - PPO | Source: Ambulatory Visit

## 2013-10-03 ENCOUNTER — Encounter (HOSPITAL_COMMUNITY)
Admission: RE | Admit: 2013-10-03 | Discharge: 2013-10-03 | Disposition: A | Discharge status: Home / Self Care | Payer: Federal, State, Local not specified - PPO | Source: Ambulatory Visit | Attending: Internal Medicine | Admitting: Internal Medicine

## 2013-10-03 DIAGNOSIS — C7951 Secondary malignant neoplasm of bone: Secondary | ICD-10-CM | POA: Insufficient documentation

## 2013-10-03 DIAGNOSIS — C73 Malignant neoplasm of thyroid gland: Secondary | ICD-10-CM | POA: Insufficient documentation

## 2013-10-03 MED ORDER — SODIUM IODIDE I 131 CAPSULE
286.0000 | Freq: Once | INTRAVENOUS | Status: AC | PRN
  Administered 2013-10-03: 286 via ORAL

## 2013-10-05 ENCOUNTER — Encounter (HOSPITAL_COMMUNITY)
Admission: RE | Admit: 2013-10-05 | Discharge: 2013-10-05 | Disposition: A | Discharge status: Home / Self Care | Payer: Federal, State, Local not specified - PPO | Source: Ambulatory Visit | Attending: Internal Medicine | Admitting: Internal Medicine

## 2013-10-12 ENCOUNTER — Encounter (HOSPITAL_COMMUNITY)
Admission: RE | Admit: 2013-10-12 | Discharge: 2013-10-12 | Disposition: A | Discharge status: Home / Self Care | Payer: Federal, State, Local not specified - PPO | Source: Ambulatory Visit | Attending: Internal Medicine | Admitting: Internal Medicine

## 2013-10-12 DIAGNOSIS — C73 Malignant neoplasm of thyroid gland: Secondary | ICD-10-CM | POA: Insufficient documentation

## 2014-05-30 ENCOUNTER — Other Ambulatory Visit (HOSPITAL_COMMUNITY): Payer: Self-pay | Admitting: Internal Medicine

## 2014-05-30 DIAGNOSIS — C73 Malignant neoplasm of thyroid gland: Secondary | ICD-10-CM

## 2014-06-14 ENCOUNTER — Ambulatory Visit (HOSPITAL_COMMUNITY): Payer: Federal, State, Local not specified - PPO

## 2014-06-17 ENCOUNTER — Encounter (HOSPITAL_COMMUNITY): Payer: Federal, State, Local not specified - PPO

## 2014-06-20 ENCOUNTER — Encounter (HOSPITAL_COMMUNITY): Payer: Self-pay

## 2014-06-20 ENCOUNTER — Encounter (HOSPITAL_COMMUNITY)
Admission: RE | Admit: 2014-06-20 | Discharge: 2014-06-20 | Disposition: A | Discharge status: Home / Self Care | Payer: Federal, State, Local not specified - PPO | Source: Ambulatory Visit | Attending: Internal Medicine | Admitting: Internal Medicine

## 2014-06-20 DIAGNOSIS — C73 Malignant neoplasm of thyroid gland: Secondary | ICD-10-CM | POA: Insufficient documentation

## 2014-06-20 MED ORDER — SODIUM IODIDE I 131 CAPSULE
195.0000 | Freq: Once | INTRAVENOUS | Status: AC | PRN
  Administered 2014-06-20: 195 via ORAL

## 2014-06-21 ENCOUNTER — Ambulatory Visit (HOSPITAL_COMMUNITY): Payer: Federal, State, Local not specified - PPO

## 2014-06-24 ENCOUNTER — Encounter (HOSPITAL_COMMUNITY): Payer: Federal, State, Local not specified - PPO

## 2014-07-01 ENCOUNTER — Encounter (HOSPITAL_COMMUNITY)
Admission: RE | Admit: 2014-07-01 | Discharge: 2014-07-01 | Disposition: A | Discharge status: Home / Self Care | Payer: Federal, State, Local not specified - PPO | Source: Ambulatory Visit | Attending: Diagnostic Radiology | Admitting: Diagnostic Radiology

## 2014-07-01 ENCOUNTER — Encounter (HOSPITAL_COMMUNITY): Payer: Federal, State, Local not specified - PPO

## 2014-07-01 DIAGNOSIS — C7951 Secondary malignant neoplasm of bone: Secondary | ICD-10-CM | POA: Insufficient documentation

## 2014-07-01 DIAGNOSIS — C73 Malignant neoplasm of thyroid gland: Secondary | ICD-10-CM | POA: Insufficient documentation

## 2014-07-01 DIAGNOSIS — C7952 Secondary malignant neoplasm of bone marrow: Secondary | ICD-10-CM

## 2016-01-07 ENCOUNTER — Other Ambulatory Visit (HOSPITAL_COMMUNITY): Payer: Self-pay | Admitting: Internal Medicine

## 2016-01-07 DIAGNOSIS — C73 Malignant neoplasm of thyroid gland: Secondary | ICD-10-CM

## 2016-01-19 ENCOUNTER — Encounter (HOSPITAL_COMMUNITY)
Admission: RE | Admit: 2016-01-19 | Discharge: 2016-01-19 | Disposition: A | Discharge status: Home / Self Care | Payer: Federal, State, Local not specified - PPO | Source: Ambulatory Visit | Attending: Internal Medicine | Admitting: Internal Medicine

## 2016-01-19 DIAGNOSIS — C73 Malignant neoplasm of thyroid gland: Secondary | ICD-10-CM | POA: Insufficient documentation

## 2016-01-19 MED ORDER — THYROTROPIN ALFA 1.1 MG IM SOLR
0.9000 mg | INTRAMUSCULAR | Status: AC
Start: 2016-01-19 — End: 2016-01-19
  Administered 2016-01-19: 0.9 mg via INTRAMUSCULAR

## 2016-01-20 ENCOUNTER — Encounter (HOSPITAL_COMMUNITY)
Admission: RE | Admit: 2016-01-20 | Discharge: 2016-01-20 | Disposition: A | Discharge status: Home / Self Care | Payer: Federal, State, Local not specified - PPO | Source: Ambulatory Visit | Attending: Internal Medicine | Admitting: Internal Medicine

## 2016-01-20 DIAGNOSIS — C73 Malignant neoplasm of thyroid gland: Secondary | ICD-10-CM | POA: Diagnosis not present

## 2016-01-20 MED ORDER — THYROTROPIN ALFA 1.1 MG IM SOLR
0.9000 mg | INTRAMUSCULAR | Status: AC
Start: 2016-01-20 — End: 2016-01-20
  Administered 2016-01-20: 0.9 mg via INTRAMUSCULAR

## 2016-01-21 ENCOUNTER — Encounter (HOSPITAL_COMMUNITY)
Admission: RE | Admit: 2016-01-21 | Discharge: 2016-01-21 | Disposition: A | Discharge status: Home / Self Care | Payer: Federal, State, Local not specified - PPO | Source: Ambulatory Visit | Attending: Internal Medicine | Admitting: Internal Medicine

## 2016-01-21 MED ORDER — SODIUM IODIDE I 131 CAPSULE
151.7000 | Freq: Once | INTRAVENOUS | Status: AC | PRN
  Administered 2016-01-21: 151.7 via ORAL

## 2016-01-21 MED ORDER — SODIUM IODIDE I 131 CAPSULE: 151.7000 | Freq: Once | INTRAVENOUS | Status: DC | PRN

## 2016-01-23 ENCOUNTER — Encounter (HOSPITAL_COMMUNITY): Admission: RE | Admit: 2016-01-23 | Payer: Federal, State, Local not specified - PPO | Source: Ambulatory Visit

## 2016-01-30 ENCOUNTER — Encounter (HOSPITAL_COMMUNITY)
Admission: RE | Admit: 2016-01-30 | Discharge: 2016-01-30 | Disposition: A | Discharge status: Home / Self Care | Payer: Federal, State, Local not specified - PPO | Source: Ambulatory Visit | Attending: Internal Medicine | Admitting: Internal Medicine

## 2016-01-30 DIAGNOSIS — C73 Malignant neoplasm of thyroid gland: Secondary | ICD-10-CM

## 2017-07-20 ENCOUNTER — Other Ambulatory Visit (HOSPITAL_COMMUNITY): Payer: Self-pay | Admitting: Internal Medicine

## 2017-07-20 DIAGNOSIS — C73 Malignant neoplasm of thyroid gland: Secondary | ICD-10-CM

## 2017-07-27 ENCOUNTER — Encounter (HOSPITAL_COMMUNITY)
Admission: RE | Admit: 2017-07-27 | Discharge: 2017-07-27 | Disposition: A | Discharge status: Home / Self Care | Payer: Federal, State, Local not specified - PPO | Source: Ambulatory Visit | Attending: Internal Medicine | Admitting: Internal Medicine

## 2017-07-27 DIAGNOSIS — C7951 Secondary malignant neoplasm of bone: Secondary | ICD-10-CM | POA: Insufficient documentation

## 2017-07-27 DIAGNOSIS — C61 Malignant neoplasm of prostate: Secondary | ICD-10-CM | POA: Insufficient documentation

## 2017-07-27 DIAGNOSIS — C73 Malignant neoplasm of thyroid gland: Secondary | ICD-10-CM | POA: Insufficient documentation

## 2017-07-27 DIAGNOSIS — E890 Postprocedural hypothyroidism: Secondary | ICD-10-CM | POA: Insufficient documentation

## 2017-07-27 MED ORDER — STERILE WATER FOR INJECTION IJ SOLN
INTRAMUSCULAR | Status: AC
  Filled 2017-07-27: qty 10

## 2017-07-27 MED ORDER — THYROTROPIN ALFA 1.1 MG IM SOLR
0.9000 mg | INTRAMUSCULAR | Status: AC
  Administered 2017-07-27: 0.9 mg via INTRAMUSCULAR

## 2017-07-28 ENCOUNTER — Encounter (HOSPITAL_COMMUNITY)
Admission: RE | Admit: 2017-07-28 | Discharge: 2017-07-28 | Disposition: A | Discharge status: Home / Self Care | Payer: Federal, State, Local not specified - PPO | Source: Ambulatory Visit | Attending: Internal Medicine | Admitting: Internal Medicine

## 2017-07-28 DIAGNOSIS — E890 Postprocedural hypothyroidism: Secondary | ICD-10-CM | POA: Diagnosis not present

## 2017-07-28 MED ORDER — STERILE WATER FOR INJECTION IJ SOLN
INTRAMUSCULAR | Status: AC
  Filled 2017-07-28: qty 10

## 2017-07-28 MED ORDER — THYROTROPIN ALFA 1.1 MG IM SOLR
0.9000 mg | INTRAMUSCULAR | Status: AC
  Administered 2017-07-28: 0.9 mg via INTRAMUSCULAR

## 2017-07-29 ENCOUNTER — Encounter (HOSPITAL_COMMUNITY)
Admission: RE | Admit: 2017-07-29 | Discharge: 2017-07-29 | Disposition: A | Discharge status: Home / Self Care | Payer: Federal, State, Local not specified - PPO | Source: Ambulatory Visit | Attending: Internal Medicine | Admitting: Internal Medicine

## 2017-07-29 DIAGNOSIS — E890 Postprocedural hypothyroidism: Secondary | ICD-10-CM | POA: Diagnosis not present

## 2017-07-29 MED ORDER — SODIUM IODIDE I 131 CAPSULE
171.2000 | Freq: Once | INTRAVENOUS | Status: AC | PRN
  Administered 2017-07-29: 171.2 via ORAL

## 2017-08-08 ENCOUNTER — Encounter (HOSPITAL_COMMUNITY)
Admission: RE | Admit: 2017-08-08 | Discharge: 2017-08-08 | Disposition: A | Discharge status: Home / Self Care | Payer: Federal, State, Local not specified - PPO | Source: Ambulatory Visit | Attending: Internal Medicine | Admitting: Internal Medicine

## 2017-08-08 DIAGNOSIS — C73 Malignant neoplasm of thyroid gland: Secondary | ICD-10-CM

## 2017-08-08 DIAGNOSIS — E890 Postprocedural hypothyroidism: Secondary | ICD-10-CM | POA: Diagnosis not present

## 2018-03-14 ENCOUNTER — Telehealth: Payer: Self-pay | Admitting: Nurse Practitioner

## 2018-03-14 ENCOUNTER — Encounter: Payer: Self-pay | Admitting: Nurse Practitioner

## 2018-03-14 NOTE — Telephone Encounter (Signed)
Appointment has been scheduled for the pt to see Leonette Nutting on 5/10 at 915am. Pt aware of the appt date and time. Letter mailed to the pt. Spoke to [place] at Dr. Cindra Eves office and gave the appt date and time. I made her aware that the pt seemed unaware as to why the pt was being referred back to Dr. Benay Spice. She voiced understanding and will make the MD aware.

## 2018-03-24 ENCOUNTER — Inpatient Hospital Stay: Payer: Federal, State, Local not specified - PPO | Attending: Nurse Practitioner | Admitting: Nurse Practitioner

## 2018-03-24 ENCOUNTER — Telehealth: Payer: Self-pay | Admitting: Nurse Practitioner

## 2018-03-24 ENCOUNTER — Ambulatory Visit (HOSPITAL_COMMUNITY)
Admission: RE | Admit: 2018-03-24 | Discharge: 2018-03-24 | Disposition: A | Discharge status: Home / Self Care | Payer: Federal, State, Local not specified - PPO | Source: Ambulatory Visit | Attending: Nurse Practitioner | Admitting: Nurse Practitioner

## 2018-03-24 VITALS — BP 143/81 | HR 79 | Temp 98.1°F | Resp 18 | Ht 76.0 in | Wt 201.8 lb

## 2018-03-24 DIAGNOSIS — C7952 Secondary malignant neoplasm of bone marrow: Secondary | ICD-10-CM | POA: Diagnosis not present

## 2018-03-24 DIAGNOSIS — C7951 Secondary malignant neoplasm of bone: Secondary | ICD-10-CM | POA: Insufficient documentation

## 2018-03-24 DIAGNOSIS — C73 Malignant neoplasm of thyroid gland: Secondary | ICD-10-CM

## 2018-03-24 DIAGNOSIS — Z923 Personal history of irradiation: Secondary | ICD-10-CM | POA: Diagnosis not present

## 2018-03-24 DIAGNOSIS — F17210 Nicotine dependence, cigarettes, uncomplicated: Secondary | ICD-10-CM | POA: Diagnosis not present

## 2018-03-24 DIAGNOSIS — Z8042 Family history of malignant neoplasm of prostate: Secondary | ICD-10-CM | POA: Insufficient documentation

## 2018-03-24 MED ORDER — HYDROCODONE-ACETAMINOPHEN 5-325 MG PO TABS: 1.0000 | ORAL_TABLET | Freq: Three times a day (TID) | ORAL | 0 refills | Status: DC | PRN

## 2018-03-24 NOTE — Progress Notes (Signed)
Gregory Snow OFFICE PROGRESS NOTE   Diagnosis: Thyroid cancer  INTERVAL HISTORY:   Gregory Snow was last seen at the cancer center in December 2013.  To review, he has metastatic thyroid cancer involving bone.  He underwent a total thyroidectomy 11/02/2012 with pathology confirming a papillary thyroid carcinoma, follicular variant.  He had a markedly elevated thyroglobulin level.  He completed palliative radiation to the lumbar spine and left iliac bone.  He was placed on thyroid hormone.  He has since been followed by Dr. Buddy Snow with radioactive iodine.  The thyroglobulin level rose after his most recent treatment and a post treatment scan revealed a new area of radioiodine uptake involving a left rib.  He has been referred back to medical oncology due to progressive metastatic disease despite significant concentration of radioactive iodine treatment.  He reports a 6-week history of left hip pain.  He describes this pain as "excruciating" with weightbearing.  The pain intermittently radiates to the knee.  He is taking ibuprofen.  No other sites of pain.  He reports an overall good appetite.  He reports a recent decline in his weight.  He does not feel this is significant.  No fevers or sweats.  No unusual headache.  No vision change.  No dysphagia.  He denies shortness of breath and cough.  No chest pain.  No constipation or diarrhea.  In the past on a few occasions he has noted a small amount of blood in his stool.  This has not occurred recently.  No nausea or vomiting.  No urinary symptoms.  Tip of the right thumb tingles at times.  Left foot periodically feels "asleep".  He lives in [place].  He currently smokes approximately 1/2 pack/day.  He has a brother with prostate cancer.  He has no known drug allergies.   Objective:  Vital signs in last 24 hours:  Blood pressure (!) 143/81, pulse 79, temperature 98.1 F (36.7 C), temperature source Oral, resp. rate 18, height 6'  4" (1.93 m), weight 201 lb 12.8 oz (91.5 kg), SpO2 98 %.    HEENT: Sclera anicteric.  Oropharynx is without thrush or ulceration.  No neck mass. Lymphatics: No palpable cervical, supraclavicular, axillary or inguinal lymph nodes. Resp: Lungs clear bilaterally. Cardio: Regular rate and rhythm. GI: Abdomen soft and nontender.  No hepatomegaly. Vascular: No leg edema. Neuro: Alert and oriented. Skin: No rash. Musculoskeletal: Nontender over the left hip/upper leg.   Lab Results:  Lab Results  Component Value Date   WBC 10.1 11/01/2012   HGB 13.6 11/01/2012   HCT 39.9 11/01/2012   MCV 94.5 11/01/2012   PLT 190 11/01/2012   NEUTROABS 9.0 (H) 10/11/2012    Imaging:  No results found.  Medications: I have reviewed the patient's current medications.  Assessment/Plan: 1. Metastatic thyroid cancer, palpable left thyroid mass confirmed on a CT 09/22/2012.    Multiple destructive bone lesions including a large lesion at L3 with a soft tissue component encroaching on the thecal sac.    Status post total thyroidectomy 11/02/2012 with pathology confirming a papillary thyroid carcinoma, follicular variant.    Markedly elevated thyroglobulin level.    Followed by Dr. Buddy Snow, treated with radioactive iodine.    08/08/2017 iodine-131 scan- multiple sites of abnormal radioiodine accumulation compatible with osseous metastasis.  Single new focus of abnormal tracer localization identified in a lateral mid left rib.  Remaining sites of abnormal uptake including proximal left femur were identified on the previous exam.  01/05/2018  thyroglobulin 1523 (1186 on 06/30/2017) 2. Pain secondary to the L3 mass with compression of the left L3 nerve root. The pain is also also related to the left iliac mass.  He is status post palliative radiation to the lumbar spine and left iliac bone, completed 11/09/2012. 3. Tobacco use.  We discussed smoking cessation.  He is not ready to quit smoking.   Disposition:  Gregory Snow is a 67 year old man with metastatic thyroid cancer recently referred back to the cancer center for evaluation due to a rising thyroglobulin level and progressive metastatic disease despite radioactive iodine treatment.  He understands the left hip/leg pain is likely related to metastatic thyroid cancer.  We are referring him for a plain x-ray today to evaluate for an impending fracture.  He was given a prescription for hydrocodone 5/325 1 tablet every 8 hours as needed.  He understands he should not drive while taking pain medication.  We discussed obtaining restaging CTs.  We also discussed systemic treatment options including a TKI.  We discussed potential toxicities with TKIs.  He is undecided as to if he wants to proceed with treatment through our office versus continued follow-up with Dr. Buddy Snow and additional treatment with radioactive iodine.  He will return for a follow-up visit in 2 weeks for additional discussion.  He will contact the office in the interim with any problems.  Patient seen with Dr. Benay Snow.  40 minutes were spent face-to-face at today's visit with the majority of that time involved in counseling/coordination of care.      Gregory Snow ANP/GNP-BC   03/24/2018  9:21 AM This was a shared visit with Gregory Snow.  Gregory Snow was interviewed and examined.  The discomfort at the left upper leg is likely related to metastatic thyroid cancer.  We will refer him for a plain x-ray of the left hip and femur today.    He has been treated with multiple courses of radioactive iodine therapy.  We discussed other treatment options including tyrosine kinase inhibitor therapy, specifically lenvatinib.  He would like to meet with Dr. Buddy Snow to discuss another trial of radioactive iodine therapy prior to committing to additional staging evaluation or other treatments.  I will discussed the case with Dr. Buddy Snow.  Gregory Manson, MD

## 2018-03-24 NOTE — Telephone Encounter (Signed)
Scheduled appt per 5/10 los - gave patient AVS

## 2018-03-27 ENCOUNTER — Telehealth: Payer: Self-pay | Admitting: Emergency Medicine

## 2018-03-27 ENCOUNTER — Telehealth: Payer: Self-pay | Admitting: Nurse Practitioner

## 2018-03-27 NOTE — Telephone Encounter (Signed)
[facility] (Dr.Kerr) called to page Dr.sherrill to discuss this pt.

## 2018-03-27 NOTE — Telephone Encounter (Signed)
I contacted Mr. Dinius with the x-ray results from last week.  Dr. Benay Spice recommends nonweightbearing status of the left leg and a referral to orthopedics. Mr. Tabbert would like to discuss the x-ray results and treatment recommendations with Dr. Buddy Duty prior to the orthopedic referral.

## 2018-03-30 ENCOUNTER — Telehealth: Payer: Self-pay | Admitting: *Deleted

## 2018-03-30 NOTE — Telephone Encounter (Signed)
Call from pt requesting to move forward with orthopedist referral. Pt also requested name of drug Dr. Benay Spice is recommending. Informed of drug name Lenvatinib.  Message to provider for referral.

## 2018-03-31 ENCOUNTER — Other Ambulatory Visit: Payer: Self-pay | Admitting: Nurse Practitioner

## 2018-03-31 DIAGNOSIS — C7952 Secondary malignant neoplasm of bone marrow: Secondary | ICD-10-CM

## 2018-03-31 DIAGNOSIS — C73 Malignant neoplasm of thyroid gland: Secondary | ICD-10-CM

## 2018-03-31 DIAGNOSIS — C7951 Secondary malignant neoplasm of bone: Secondary | ICD-10-CM

## 2018-04-07 ENCOUNTER — Telehealth: Payer: Self-pay | Admitting: Oncology

## 2018-04-07 ENCOUNTER — Ambulatory Visit: Payer: Federal, State, Local not specified - PPO

## 2018-04-07 ENCOUNTER — Inpatient Hospital Stay: Payer: Federal, State, Local not specified - PPO

## 2018-04-07 ENCOUNTER — Inpatient Hospital Stay: Payer: Federal, State, Local not specified - PPO | Admitting: Oncology

## 2018-04-07 VITALS — BP 130/82 | HR 70 | Temp 98.5°F | Resp 18 | Ht 76.0 in | Wt 198.3 lb

## 2018-04-07 DIAGNOSIS — C73 Malignant neoplasm of thyroid gland: Secondary | ICD-10-CM

## 2018-04-07 DIAGNOSIS — F17210 Nicotine dependence, cigarettes, uncomplicated: Secondary | ICD-10-CM | POA: Diagnosis not present

## 2018-04-07 DIAGNOSIS — Z8042 Family history of malignant neoplasm of prostate: Secondary | ICD-10-CM

## 2018-04-07 DIAGNOSIS — C7951 Secondary malignant neoplasm of bone: Secondary | ICD-10-CM

## 2018-04-07 DIAGNOSIS — Z923 Personal history of irradiation: Secondary | ICD-10-CM

## 2018-04-07 LAB — CBC WITH DIFFERENTIAL (CANCER CENTER ONLY)
BASOS ABS: 0 10*3/uL (ref 0.0–0.1)
BASOS PCT: 1 %
EOS PCT: 0 %
Eosinophils Absolute: 0 10*3/uL (ref 0.0–0.5)
HCT: 44.8 % (ref 38.4–49.9)
HEMOGLOBIN: 15.1 g/dL (ref 13.0–17.1)
LYMPHS ABS: 0.9 10*3/uL (ref 0.9–3.3)
Lymphocytes Relative: 9 %
MCH: 33.1 pg (ref 27.2–33.4)
MCHC: 33.6 g/dL (ref 32.0–36.0)
MCV: 98.6 fL — ABNORMAL HIGH (ref 79.3–98.0)
Monocytes Absolute: 0.5 10*3/uL (ref 0.1–0.9)
Monocytes Relative: 5 %
NEUTROS PCT: 85 %
Neutro Abs: 9 10*3/uL — ABNORMAL HIGH (ref 1.5–6.5)
PLATELETS: 205 10*3/uL (ref 140–400)
RBC: 4.55 MIL/uL (ref 4.20–5.82)
RDW: 13.4 % (ref 11.0–14.6)
WBC Count: 10.5 10*3/uL — ABNORMAL HIGH (ref 4.0–10.3)

## 2018-04-07 LAB — CMP (CANCER CENTER ONLY)
ALT: 14 U/L (ref 0–55)
AST: 14 U/L (ref 5–34)
Albumin: 4.4 g/dL (ref 3.5–5.0)
Alkaline Phosphatase: 91 U/L (ref 40–150)
Anion gap: 12 — ABNORMAL HIGH (ref 3–11)
BUN: 17 mg/dL (ref 7–26)
CHLORIDE: 102 mmol/L (ref 98–109)
CO2: 26 mmol/L (ref 22–29)
Calcium: 9.9 mg/dL (ref 8.4–10.4)
Creatinine: 0.88 mg/dL (ref 0.70–1.30)
GFR, Estimated: >60 mL/min (ref >60)
GLUCOSE: 111 mg/dL (ref 70–140)
Potassium: 4.2 mmol/L (ref 3.5–5.1)
Sodium: 140 mmol/L (ref 136–145)
Total Bilirubin: 0.9 mg/dL (ref 0.2–1.2)
Total Protein: 8 g/dL (ref 6.4–8.3)

## 2018-04-07 MED ORDER — ALPRAZOLAM 0.5 MG PO TABS: ORAL_TABLET | ORAL | 0 refills | Status: DC

## 2018-04-07 NOTE — Telephone Encounter (Signed)
Scheduled appt per 5/24 los - Gave pt avs and calender per los.

## 2018-04-07 NOTE — Progress Notes (Signed)
[name] OFFICE PROGRESS NOTE   Diagnosis: Thyroid cancer  INTERVAL HISTORY:   Gregory Snow returns for a scheduled visit.  When he was here on 03/24/2018 a plain x-ray of the left femur confirmed a large lytic lesion in the proximal femoral diaphysis.  We recommended he be nonweightbearing on the left leg.  He was referred to orthopedics.  He declines an orthopedic referral. He continues to have discomfort when standing on the left leg.  No other sites of pain.  No other complaint. He met with Dr. Buddy Snow and would now like to begin tyrosine kinase inhibitor therapy.  Objective:  Vital signs in last 24 hours:  Blood pressure 130/82, pulse 70, temperature 98.5 F (36.9 C), temperature source Oral, resp. rate 18, height '6\' 4"'  (1.93 m), weight 198 lb 4.8 oz (89.9 kg), SpO2 98 %.    Lymphatics: No cervical or supraclavicular nodes Resp: Lungs clear bilaterally Cardio: Regular rate and rhythm GI: No hepatomegaly, no mass, nontender Vascular: No leg edema Musculoskeletal: No pain with motion at the left hip, tender to percussion at the left upper thigh    Lab Results:  Lab Results  Component Value Date   WBC 10.1 11/01/2012   HGB 13.6 11/01/2012   HCT 39.9 11/01/2012   MCV 94.5 11/01/2012   PLT 190 11/01/2012   NEUTROABS 9.0 (H) 10/11/2012    CMP  Lab Results  Component Value Date   NA 137 11/01/2012   K 4.4 11/01/2012   CL 101 11/01/2012   CO2 26 11/01/2012   GLUCOSE 105 (H) 11/01/2012   BUN 18 11/01/2012   CREATININE 0.77 11/01/2012   CALCIUM 9.0 11/03/2012   PROT 7.0 10/11/2012   ALBUMIN 4.2 10/11/2012   AST 13 10/11/2012   ALT 16 10/11/2012   ALKPHOS 69 10/11/2012   BILITOT 0.91 10/11/2012   GFRNONAA >90 11/01/2012   GFRAA >90 11/01/2012     Medications: I have reviewed the patient's current medications.   Assessment/Plan:  1. Metastatic papillary thyroid cancer, palpable left thyroid mass confirmed on a CT 09/22/2012.    Multiple  destructive bone lesions including a large lesion at L3 with a soft tissue component encroaching on the thecal sac.    Status post total thyroidectomy 11/02/2012 with pathology confirming a papillary thyroid carcinoma, follicular variant.    Markedly elevated thyroglobulin level.    Followed by Dr. Buddy Snow, treated with radioactive iodine.    08/08/2017 iodine-131 scan- multiple sites of abnormal radioiodine accumulation compatible with osseous metastasis.  Single new focus of abnormal tracer localization identified in a lateral mid left rib.  Remaining sites of abnormal uptake including proximal left femur were identified on the previous exam.  01/05/2018 thyroglobulin 1523 (1186 on 06/30/2017) 2. Pain secondary to the L3 mass with compression of the left L3 nerve root. The pain is also also related to the left iliac mass.  He is status post palliative radiation to the lumbar spine and left iliac bone, completed 11/09/2012. 3. Tobacco use.  We discussed smoking cessation.  He is not ready to quit smoking. 4. Pain at the left upper leg-x-ray 03/24/2018 revealed a large lytic lesion in the proximal left femoral diaphysis    Disposition: Gregory Snow appears stable.  I recommended he see orthopedic surgery to discuss management of the lytic lesion at the left femur.  He declines an orthopedics referral.  He understands the potential for a left femur fracture.  He has clinical evidence of progressive metastatic disease.  The  tumor has progressed despite multiple treatments with radioactive iodine.  He agrees to begin systemic therapy.  I recommend lenvatinib.  He declines a staging iodine scan and CT.  He agrees to a chest x-ray and baseline thyroglobulin level. We reviewed the potential toxicities associated with lenvatinib including the chance for hypertension, bleeding, [place] plantar dysesthesia, rash, diarrhea, nausea, mouth sores, and hematologic toxicity.  He will meet with a Cancer center  pharmacist to further review potential toxicities associated with this agent. We anticipate he will start lenvatinib on 04/17/2018.  He will return for an office and lab visit on 04/28/2018.  25 minutes were spent with the patient today.  The majority of the time was used for counseling and coordination of care.   Betsy Coder, MD  04/07/2018  8:50 AM

## 2018-04-11 ENCOUNTER — Telehealth: Payer: Self-pay | Admitting: Pharmacist

## 2018-04-11 ENCOUNTER — Telehealth: Payer: Self-pay | Admitting: Pharmacy Technician

## 2018-04-11 DIAGNOSIS — C73 Malignant neoplasm of thyroid gland: Secondary | ICD-10-CM

## 2018-04-11 MED ORDER — LENVATINIB (24 MG DAILY DOSE) 2 X 10 MG & 4 MG PO CPPK: 24.0000 mg | ORAL_CAPSULE | Freq: Every day | ORAL | 0 refills | Status: DC

## 2018-04-11 NOTE — Telephone Encounter (Signed)
Oral Oncology Pharmacist Encounter  Received new prescription for Lenvima (lenvatinib) for the treatment of metastatic, differentiated, papillary thyroid carcinoma, follicular variant, refractory to radioactive iodine treatments, planned duration until disease progression or unacceptable toxicity.  Labs from 04/07/18 assessed, OK for treatment.  BPs reviewed, readings WNL, will continue to be monitored. Patient noted with atenolol on medication list  No baseline TSH or urine protein in Epic, both have been ordered for next office visit on 6/14  No baseline assessment of QTc in Epic, QTc prolongation has been noted with Lenvima use. Baseline EKG is recommended in patients with congenital long QT syndrome, heart failure, bradyarrhythmias, or on concomitant medications known to prolong QT No cardiac history noted, no QT prolonging medications on current medication list No need for baseline EKG in this patient Electrolytes (including magnesium) will be monitored.  Current medication list in Epic reviewed, DDIs with Lenvima identified.  Prescription has been e-scribed to [place] (FEP department phone: [phone]) for benefits analysis and approval per insurance requirement.  Oral Oncology Clinic will continue to follow for insurance authorization, copayment issues, initial counseling and start date.  Johny Drilling, PharmD, BCPS, BCOP  04/11/2018 11:57 AM Oral Oncology Clinic [phone]

## 2018-04-11 NOTE — Telephone Encounter (Signed)
Oral Chemotherapy Pharmacist Encounter   I spoke with patient for overview of: Lenvima (lenvatinib).   Counseled patient on administration, dosing, side effects, monitoring, drug-food interactions, safe handling, storage, and disposal.  Patient will take Lenvima 10mg  capsules  X 2 and Lenvima 4mg  capsules x 1 (24 mg total) by mouth once daily, with or without food, at approximately the same time each day.  Lenvima start date: 04/17/18  Adverse effects include but are not limited to: hypertension, hand-foot syndrome, diarrhea, joint pain, fatigue, headache, decreased calcium, proteinuria, increased risk of blood clots, and cardiac conduction issues.   Patient will obtain anti diarrheal and alert the office of 4 or more loose stools above baseline.  Patient instructed to notify office of any upcoming invasive procedures.  [name] will be held for 6 days prior to scheduled surgery, restart based on healing and clinical judgement.   Reviewed with patient importance of keeping a medication schedule and plan for any missed doses.  Mr. Mounce voiced understanding and appreciation.   All questions answered. Medication reconciliation performed and medication/allergy list updated.  Patient updated about [facility] as dispensing pharmacy for [facility] per insurance requirement.  He was informed that insurance authorization has been obtained and prescription has been e-scribed today. I provided phone number to [facility] department to patient [phone]) He was instructed to call [facility] by the end of the day on Thursday (04/13/18) if he had not yet heard from them.  Patient informed to request pharmacy to obtain manufacturer copayment coupon to reduce out of pocket expense.  Patient knows to call the office with questions or concerns. Oral Oncology Clinic will continue to follow.  Thank you,  Johny Drilling, PharmD, BCPS, BCOP  04/11/2018   2:25 PM Oral Oncology  Clinic [phone]

## 2018-04-11 NOTE — Telephone Encounter (Signed)
Oral Oncology Patient Advocate Encounter  Received notification from [facility] that prior authorization for Gregory Snow is required.  PA submitted on CoverMyMeds Key [id number] Status is pending  Oral Oncology Clinic will continue to follow.  [name]. [name], [name] [name] [phone] 04/11/2018 11:18 AM

## 2018-04-11 NOTE — Telephone Encounter (Signed)
Oral Oncology Patient Advocate Encounter  Prior Authorization for Gregory Snow has been approved.    PA# [id number] Effective dates: 03/12/2018 through 04/11/2019  Oral Oncology Clinic will continue to follow.   [name]. [name], [place] Patient [place] [phone] 04/11/2018 11:19 AM

## 2018-04-13 MED ORDER — LENVATINIB (24 MG DAILY DOSE) 2 X 10 MG & 4 MG PO CPPK: 24.0000 mg | ORAL_CAPSULE | Freq: Every day | ORAL | 0 refills | Status: DC

## 2018-04-13 MED FILL — LENVIMA 24 MG DAILY DOSE: 2 X 10 MG & | 30 days supply | Qty: 90 | Fill #0

## 2018-04-13 NOTE — Telephone Encounter (Signed)
Oral Oncology Pharmacist Encounter  Received call from patient that he had reached out to [facility] pharmacy about status of Lenvima [facility] is mandated pharmacy for [employer] per insurance requirement, but they do not have access to buy and sell Lenvima.  Prescription now e-scribed to [facility]. Oral oncology Patient Advocate is helping patient enroll for manufacturer copayment coupon to reduce out of pocket expense from $70 to $10 per fill.  Patient will pick-up his Lenvima from the [place] tomorrow (04/14/18) to start on 04/17/18.  Patient knows to call the office with additional questions or concerns.  Oral Oncology Clinic will continue to follow.  Johny Drilling, PharmD, BCPS, BCOP  04/13/2018 3:26 PM Oral Oncology Clinic [phone]

## 2018-04-17 ENCOUNTER — Telehealth: Payer: Self-pay | Admitting: Pharmacist

## 2018-04-17 NOTE — Telephone Encounter (Signed)
Oral Oncology Pharmacist Encounter  Received call from patient with questions about Lenvima co-pay card.  Patient stated he had talked to manufacturer [employer]) on Friday (04/14/2018) and they had not received documentation required from the office in order to proceed with processing of co-pay card for him.  Co-pay card from manufacturer required assistance application be sent from the office. This application was sent on 04/13/2018.  I called [employer] at [phone]) to check on application. Representative stated they were still processing faxes sent on 530. She was able to find that the application that was sent by this office.  Representative states they will process the application through today sometime and reach out to patient for financial status verification. At that point they will be able to process billing information for co-pay card.  While on the phone with patient this morning he stated  Lenvima start date 04/17/18  He paid $70 for 1st fill from the [facility] and picked it up on 04/14/18  He did note increase in bone and back pain over the weekend. He states he is taking his Norco, however he had taken his first full dose just this morning. He will alert the office if he requires additional options for pain control.  Oral Oncology Clinic will continue to follow.  Johny Drilling, PharmD, BCPS, BCOP  04/17/2018 12:05 PM Oral Oncology Clinic [phone]

## 2018-04-18 LAB — THYROGLOBULIN LEVEL: Thyroglobulin: >500 ng/mL — ABNORMAL HIGH

## 2018-04-20 ENCOUNTER — Telehealth: Payer: Self-pay

## 2018-04-20 DIAGNOSIS — C73 Malignant neoplasm of thyroid gland: Secondary | ICD-10-CM

## 2018-04-20 MED ORDER — OXYCODONE-ACETAMINOPHEN 5-325 MG PO TABS: 1.0000 | ORAL_TABLET | ORAL | 0 refills | Status: DC | PRN

## 2018-04-20 NOTE — Telephone Encounter (Signed)
Returned call to pt. Pt requesting stronger pain medication to help control pain. Per Dr Benay Spice, percocet prescribed. Pt also questioned increasing Ibuprofen dosage. Per MD, pt to continue same dose of ibuprofen, will re-evaluate. Pt voiced understanding and given instruction to pick up prescription paper'

## 2018-04-24 MED FILL — OXYCODONE-ACETAMINOPHEN 5-3: 5-325 | 5 days supply | Qty: 60 | Fill #0

## 2018-04-24 MED FILL — ATENOLOL 25 MG TABLET: 25 | 30 days supply | Qty: 30 | Fill #0

## 2018-04-28 ENCOUNTER — Encounter: Payer: Self-pay | Admitting: Nurse Practitioner

## 2018-04-28 ENCOUNTER — Inpatient Hospital Stay: Payer: Federal, State, Local not specified - PPO

## 2018-04-28 ENCOUNTER — Telehealth: Payer: Self-pay

## 2018-04-28 ENCOUNTER — Inpatient Hospital Stay: Payer: Federal, State, Local not specified - PPO | Attending: Nurse Practitioner | Admitting: Nurse Practitioner

## 2018-04-28 VITALS — BP 138/92 | HR 63 | Temp 97.9°F | Resp 18 | Ht 76.0 in | Wt 198.9 lb

## 2018-04-28 DIAGNOSIS — C73 Malignant neoplasm of thyroid gland: Secondary | ICD-10-CM | POA: Diagnosis present

## 2018-04-28 DIAGNOSIS — Z923 Personal history of irradiation: Secondary | ICD-10-CM | POA: Insufficient documentation

## 2018-04-28 DIAGNOSIS — C7951 Secondary malignant neoplasm of bone: Secondary | ICD-10-CM

## 2018-04-28 DIAGNOSIS — C7952 Secondary malignant neoplasm of bone marrow: Secondary | ICD-10-CM

## 2018-04-28 LAB — CBC WITH DIFFERENTIAL/PLATELET
Basophils Absolute: 0.1 10*3/uL (ref 0.0–0.1)
Basophils Relative: 1 %
EOS PCT: 1 %
Eosinophils Absolute: 0.1 10*3/uL (ref 0.0–0.5)
HCT: 42.7 % (ref 38.4–49.9)
HEMOGLOBIN: 14.8 g/dL (ref 13.0–17.1)
LYMPHS ABS: 1.1 10*3/uL (ref 0.9–3.3)
LYMPHS PCT: 13 %
MCH: 33.3 pg (ref 27.2–33.4)
MCHC: 34.8 g/dL (ref 32.0–36.0)
MCV: 95.7 fL (ref 79.3–98.0)
MONOS PCT: 6 %
Monocytes Absolute: 0.5 10*3/uL (ref 0.1–0.9)
Neutro Abs: 6.6 10*3/uL — ABNORMAL HIGH (ref 1.5–6.5)
Neutrophils Relative %: 79 %
PLATELETS: 158 10*3/uL (ref 140–400)
RBC: 4.46 MIL/uL (ref 4.20–5.82)
RDW: 13.4 % (ref 11.0–14.6)
WBC: 8.3 10*3/uL (ref 4.0–10.3)

## 2018-04-28 LAB — COMPREHENSIVE METABOLIC PANEL
ALK PHOS: 90 U/L (ref 40–150)
ALT: 13 U/L (ref 0–55)
ANION GAP: 10 (ref 3–11)
AST: 15 U/L (ref 5–34)
Albumin: 3.9 g/dL (ref 3.5–5.0)
BUN: 16 mg/dL (ref 7–26)
CALCIUM: 9.1 mg/dL (ref 8.4–10.4)
CO2: 24 mmol/L (ref 22–29)
CREATININE: 0.79 mg/dL (ref 0.70–1.30)
Chloride: 106 mmol/L (ref 98–109)
Glucose, Bld: 103 mg/dL (ref 70–140)
Potassium: 4.6 mmol/L (ref 3.5–5.1)
Sodium: 140 mmol/L (ref 136–145)
Total Bilirubin: 0.5 mg/dL (ref 0.2–1.2)
Total Protein: 7 g/dL (ref 6.4–8.3)

## 2018-04-28 LAB — TOTAL PROTEIN, URINE DIPSTICK: Protein, ur: 30 mg/dL — AB

## 2018-04-28 LAB — MAGNESIUM: Magnesium: 1.9 mg/dL (ref 1.7–2.4)

## 2018-04-28 LAB — TSH: TSH: 0.209 u[IU]/mL — ABNORMAL LOW (ref 0.320–4.118)

## 2018-04-28 NOTE — Progress Notes (Addendum)
[place] OFFICE PROGRESS NOTE   Diagnosis: Thyroid cancer  INTERVAL HISTORY:   Gregory Snow returns as scheduled.  He began lenvatinib 04/17/2018.  He denies nausea/vomiting.  No mouth sores.  No diarrhea.  No hand or foot pain or redness.  He has a small dry area on the right palm which he thinks is related to pressure from the crutch.  He continues to have discomfort at the left leg.  He has not tried Percocet.  Since starting lenvatinib he has noted tightness/discomfort at the low back and left lower leg.  The discomfort is relieved with ibuprofen.  Objective:  Vital signs in last 24 hours:  Blood pressure (!) 138/92, pulse 63, temperature 97.9 F (36.6 C), temperature source Oral, resp. rate 18, height 6\' 4"  (1.93 m), weight 198 lb 14.4 oz (90.2 kg), SpO2 100 %.    HEENT: No thrush or ulcers. Resp: Lungs clear bilaterally. Cardio: Regular rate and rhythm. GI: Abdomen soft and nontender.  No hepatomegaly. Vascular: No leg edema.  Skin: No rash.  Right palm with a small area of dryness/skin thickening.   Lab Results:  Lab Results  Component Value Date   WBC 8.3 04/28/2018   HGB 14.8 04/28/2018   HCT 42.7 04/28/2018   MCV 95.7 04/28/2018   PLT 158 04/28/2018   NEUTROABS 6.6 (H) 04/28/2018    Imaging:  No results found.  Medications: I have reviewed the patient's current medications.  Assessment/Plan: 1. Metastatic papillary thyroid cancer, palpable left thyroid mass confirmed on a CT11/06/2012.   Multiple destructive bone lesions including a large lesion at L3 with a soft tissue component encroaching on the thecal sac.   Status post total thyroidectomy 11/02/2012 with pathology confirming a papillary thyroid carcinoma, follicular variant.   Markedly elevated thyroglobulin level.   Followed by Dr. Buddy Duty, treated with radioactive iodine.  08/08/2017 iodine-131 scan- multiple sites of abnormal radioiodine accumulation compatible with osseous  metastasis. Single new focus of abnormal tracer localization identified in a lateral mid left rib. Remaining sites of abnormal uptake including proximal left femur were identified on the previous exam.  01/05/2018 thyroglobulin 1523 (1186 on 06/30/2017)  04/07/2018 thyroglobulin level greater than 500  Initiation of Lenvatinib 04/17/2018 2. Pain secondary to the L3 mass with compression of the left L3 nerve root. The pain is also also related to the left iliac mass.He is status post palliative radiation to the lumbar spine and left iliac bone, completed 11/09/2012. 3. Tobacco use.We discussed smoking cessation. He is not ready to quit smoking. 4. Pain at the left upper leg-x-ray 03/24/2018 revealed a large lytic lesion in the proximal left femoral diaphysis.  Referral to orthopedic surgery recommended.  Patient declined.   Disposition: Gregory Snow appears stable.  He will continue lenvatinib.  Thus far he appears to be tolerating without significant acute toxicity.  At today's visit he inquired about exercise.  We encouraged him to be nonweightbearing on the left leg.  We again recommended a referral to orthopedics which he declines.  Initial blood pressure was higher than his baseline.  Repeat blood pressure improved.  Plan to continue to monitor.  He will return for lab and follow-up in 3 weeks.  He will contact the office in the interim with any problems.  Patient seen with Dr. Benay Spice.    Ned Card ANP/GNP-BC   04/28/2018  9:56 AM   This was a shared visit with Ned Card.  Gregory Snow appears to be tolerating the lenvatinib well.  He continues to have pain in the left leg.  He declines an orthopedic referral.  He will return for an office and lab visit in 3 weeks. We will follow his clinical status and thyroglobulin level as markers of response to lenvatinib.  He declines further x-ray evaluation.  Julieanne Manson, MD

## 2018-04-28 NOTE — Telephone Encounter (Signed)
Printed avs and calender of upcoming appointment. Per 6/14 los 

## 2018-05-01 ENCOUNTER — Other Ambulatory Visit: Payer: Self-pay | Admitting: Oncology

## 2018-05-01 DIAGNOSIS — C73 Malignant neoplasm of thyroid gland: Secondary | ICD-10-CM

## 2018-05-05 ENCOUNTER — Telehealth: Payer: Self-pay

## 2018-05-05 NOTE — Telephone Encounter (Signed)
Call from pt reporting "bad pain in lower back" Pt states he lifted something heavy and that's when the pain developed. "I am having back spasms, will a muscle relaxer help?". Per Ned Card, NP pt should be evaluated at closest clinic due to risk for fracturing and he can follow the recommendations of the clinicians there. Pt voiced understanding. Will keep [place] updated

## 2018-05-15 ENCOUNTER — Telehealth: Payer: Self-pay | Admitting: Emergency Medicine

## 2018-05-15 MED FILL — LENVIMA 24 MG DAILY DOSE: 2 X 10 MG & | 30 days supply | Qty: 90 | Fill #0

## 2018-05-15 NOTE — Telephone Encounter (Addendum)
Faxed to [employer] @ [phone]  ----- Message from Owens Shark, NP sent at 05/12/2018  8:53 AM EDT ----- Please forward labs from 04/28/2018 to Dr. Delrae Rend.

## 2018-05-17 MED FILL — ATENOLOL 25 MG TABLET: 25 | 30 days supply | Qty: 30 | Fill #1

## 2018-05-19 ENCOUNTER — Other Ambulatory Visit: Payer: Self-pay | Admitting: Nurse Practitioner

## 2018-05-19 DIAGNOSIS — C73 Malignant neoplasm of thyroid gland: Secondary | ICD-10-CM

## 2018-05-20 ENCOUNTER — Other Ambulatory Visit: Payer: Self-pay | Admitting: Oncology

## 2018-05-20 MED ORDER — METOCLOPRAMIDE HCL 5 MG PO TABS: 10.0000 mg | ORAL_TABLET | Freq: Three times a day (TID) | ORAL | 1 refills | Status: DC

## 2018-05-20 NOTE — Progress Notes (Signed)
Pt called complaining of "indigestion." esp PM, which makes it uncomfortale for him to get to sleep. Prescribed reglan 5 mg PO ACHS PRN

## 2018-05-22 ENCOUNTER — Encounter: Payer: Self-pay | Admitting: Oncology

## 2018-05-22 ENCOUNTER — Telehealth: Payer: Self-pay | Admitting: Oncology

## 2018-05-22 ENCOUNTER — Inpatient Hospital Stay: Payer: Federal, State, Local not specified - PPO

## 2018-05-22 ENCOUNTER — Inpatient Hospital Stay: Payer: Federal, State, Local not specified - PPO | Attending: Nurse Practitioner | Admitting: Oncology

## 2018-05-22 VITALS — BP 150/100 | HR 65 | Temp 97.8°F | Resp 18 | Ht 76.0 in | Wt 190.7 lb

## 2018-05-22 DIAGNOSIS — R809 Proteinuria, unspecified: Secondary | ICD-10-CM | POA: Diagnosis not present

## 2018-05-22 DIAGNOSIS — C73 Malignant neoplasm of thyroid gland: Secondary | ICD-10-CM | POA: Diagnosis not present

## 2018-05-22 DIAGNOSIS — C7951 Secondary malignant neoplasm of bone: Secondary | ICD-10-CM | POA: Diagnosis not present

## 2018-05-22 DIAGNOSIS — G893 Neoplasm related pain (acute) (chronic): Secondary | ICD-10-CM | POA: Insufficient documentation

## 2018-05-22 DIAGNOSIS — I10 Essential (primary) hypertension: Secondary | ICD-10-CM | POA: Diagnosis not present

## 2018-05-22 DIAGNOSIS — F17210 Nicotine dependence, cigarettes, uncomplicated: Secondary | ICD-10-CM | POA: Diagnosis not present

## 2018-05-22 DIAGNOSIS — M79662 Pain in left lower leg: Secondary | ICD-10-CM | POA: Diagnosis not present

## 2018-05-22 DIAGNOSIS — C7952 Secondary malignant neoplasm of bone marrow: Principal | ICD-10-CM

## 2018-05-22 LAB — URINALYSIS, COMPLETE (UACMP) WITH MICROSCOPIC
BILIRUBIN URINE: NEGATIVE
Glucose, UA: NEGATIVE mg/dL
Ketones, ur: NEGATIVE mg/dL
LEUKOCYTES UA: NEGATIVE
NITRITE: NEGATIVE
PH: 5 (ref 5.0–8.0)
Protein, ur: 100 mg/dL — AB
SPECIFIC GRAVITY, URINE: 1.029 (ref 1.005–1.030)

## 2018-05-22 LAB — CBC WITH DIFFERENTIAL (CANCER CENTER ONLY)
Basophils Absolute: 0.1 10*3/uL (ref 0.0–0.1)
Basophils Relative: 1 %
EOS ABS: 0.1 10*3/uL (ref 0.0–0.5)
EOS PCT: 1 %
HCT: 45.8 % (ref 38.4–49.9)
Hemoglobin: 16.1 g/dL (ref 13.0–17.1)
LYMPHS ABS: 0.9 10*3/uL (ref 0.9–3.3)
Lymphocytes Relative: 10 %
MCH: 34 pg — ABNORMAL HIGH (ref 27.2–33.4)
MCHC: 35.1 g/dL (ref 32.0–36.0)
MCV: 96.9 fL (ref 79.3–98.0)
Monocytes Absolute: 0.8 10*3/uL (ref 0.1–0.9)
Monocytes Relative: 8 %
Neutro Abs: 7.3 10*3/uL — ABNORMAL HIGH (ref 1.5–6.5)
Neutrophils Relative %: 80 %
PLATELETS: 167 10*3/uL (ref 140–400)
RBC: 4.73 MIL/uL (ref 4.20–5.82)
RDW: 14.2 % (ref 11.0–14.6)
WBC: 9.2 10*3/uL (ref 4.0–10.3)

## 2018-05-22 LAB — CMP (CANCER CENTER ONLY)
ALT: 13 U/L (ref 0–44)
ANION GAP: 9 (ref 5–15)
AST: 13 U/L — ABNORMAL LOW (ref 15–41)
Albumin: 3.8 g/dL (ref 3.5–5.0)
Alkaline Phosphatase: 75 U/L (ref 38–126)
BUN: 14 mg/dL (ref 8–23)
CHLORIDE: 103 mmol/L (ref 98–111)
CO2: 27 mmol/L (ref 22–32)
Calcium: 9.7 mg/dL (ref 8.9–10.3)
Creatinine: 0.83 mg/dL (ref 0.61–1.24)
GFR, Estimated: >60 mL/min (ref >60)
Glucose, Bld: 110 mg/dL — ABNORMAL HIGH (ref 70–99)
Potassium: 4.1 mmol/L (ref 3.5–5.1)
SODIUM: 139 mmol/L (ref 135–145)
Total Bilirubin: 1 mg/dL (ref 0.3–1.2)
Total Protein: 7.2 g/dL (ref 6.5–8.1)

## 2018-05-22 LAB — TSH: TSH: 0.984 u[IU]/mL (ref 0.320–4.118)

## 2018-05-22 LAB — TOTAL PROTEIN, URINE DIPSTICK: PROTEIN: 300 mg/dL — AB

## 2018-05-22 LAB — MAGNESIUM: Magnesium: 2.1 mg/dL (ref 1.7–2.4)

## 2018-05-22 MED ORDER — ATENOLOL 50 MG PO TABS: 50.0000 mg | ORAL_TABLET | Freq: Every day | ORAL | 0 refills | Status: DC

## 2018-05-22 MED ORDER — HYDROCODONE-ACETAMINOPHEN 10-325 MG PO TABS: 1.0000 | ORAL_TABLET | Freq: Four times a day (QID) | ORAL | 0 refills | Status: DC | PRN

## 2018-05-22 MED FILL — ATENOLOL 50 MG TABLET: 50 | 30 days supply | Qty: 30 | Fill #0

## 2018-05-22 MED FILL — LEVOTHYROXINE 175 MCG TAB: 175 | 30 days supply | Qty: 30 | Fill #0

## 2018-05-22 MED FILL — HYDROCODON-APAP 10-325: 10-325 | 15 days supply | Qty: 60 | Fill #0

## 2018-05-22 MED FILL — METOCLOPRAMIDE 5 MG TABLET: 5 | 5 days supply | Qty: 40 | Fill #0

## 2018-05-22 NOTE — Telephone Encounter (Signed)
Scheduled appt per 7/8 los- gave patient  AVS and calender per los,. - pt to get urine jug for 24 hour urine today.

## 2018-05-22 NOTE — Addendum Note (Signed)
Addended by: Jethro Bolus A on: 05/22/2018 05:06 PM   Modules accepted: Orders

## 2018-05-22 NOTE — Progress Notes (Signed)
[place] OFFICE PROGRESS NOTE   Diagnosis: Thyroid cancer  INTERVAL HISTORY:   Gregory Snow returns as scheduled.  He continues lenvatinib.  He reports improvement in the left leg pain.  He has developed fatigue.  The abdomen is bloated in the evening.  He contacted the on-call physician over the weekend and was prescribed Reglan.  He has not started the Reglan.  He injured the right lower back and had spasms a few weeks ago.  This has improved.  Objective:  Vital signs in last 24 hours:  Blood pressure (!) 150/100, pulse 65, temperature 97.8 F (36.6 C), temperature source Oral, resp. rate 18, height 6\' 4"  (1.93 m), weight 190 lb 11.2 oz (86.5 kg), SpO2 97 %.    HEENT: No thrush or ulcers Resp: Lungs clear bilaterally Cardio: Regular rate and rhythm GI: Nontender, no hepatomegaly, soft Vascular: No leg edema  Skin: No rash    Lab Results:  Lab Results  Component Value Date   WBC 9.2 05/22/2018   HGB 16.1 05/22/2018   HCT 45.8 05/22/2018   MCV 96.9 05/22/2018   PLT 167 05/22/2018   NEUTROABS 7.3 (H) 05/22/2018    CMP  Lab Results  Component Value Date   NA 139 05/22/2018   K 4.1 05/22/2018   CL 103 05/22/2018   CO2 27 05/22/2018   GLUCOSE 110 (H) 05/22/2018   BUN 14 05/22/2018   CREATININE 0.83 05/22/2018   CALCIUM 9.7 05/22/2018   PROT 7.2 05/22/2018   ALBUMIN 3.8 05/22/2018   AST 13 (L) 05/22/2018   ALT 13 05/22/2018   ALKPHOS 75 05/22/2018   BILITOT 1.0 05/22/2018   GFRNONAA >60 05/22/2018   GFRAA >60 05/22/2018     Medications: I have reviewed the patient's current medications.   Assessment/Plan: 1. 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   Multiple destructive bone lesions including a large lesion at L3 with a soft tissue component encroaching on the thecal sac.   Status post total thyroidectomy 11/02/2012 with pathology confirming a papillary thyroid carcinoma,  follicular variant.   Markedly elevated thyroglobulin level.   Followed by Dr. Buddy Snow, treated with radioactive iodine.  08/08/2017 iodine-131 scan-multiple sites of abnormal radioiodine accumulation compatible with osseous metastasis. Single new focus of abnormal tracer localization identified in a lateral mid left rib. Remaining sites of abnormal uptake including proximal left femur were identified on the previous exam.  01/05/2018 thyroglobulin 1523 (1186 on 06/30/2017)  04/07/2018 thyroglobulin level greater than 500  Initiation of Lenvatinib 04/17/2018 2. Pain secondary to the L3 mass with compression of the left L3 nerve root. The pain is also also related to the left iliac mass.He is status post palliative radiation to the lumbar spine and left iliac bone, completed 11/09/2012. 3. Tobacco use.We discussed smoking cessation. He is not ready to quit smoking. 4. Pain at the left upper leg-x-ray 03/24/2018 revealed a large lytic lesion in the proximal left femoral diaphysis.  Referral to orthopedic surgery recommended.  Patient declined. 5. Proteinuria 05/22/2018- potentially related to urinary tract infection versus lenvatinib, we will check a urine culture 6. Hypertension- progressive while on the Med-Neb, he will increase the atenolol to twice daily   Disposition: [name] has metastatic thyroid cancer.  He has been maintained on lenvatinib for the past month.  He appears to be tolerating the lenvatinib well, though he has developed hypertension and proteinuria.  There are white cells and bacteria in the urine.  This may account for  the proteinuria.  We will check a urine culture.  He will increase the atenolol for hypertension.  He will return for a repeat blood pressure check within the next few days.  If the blood pressure has not improved we will add a second antihypertensive agent.  He will hold lenvatinib until he completes a 24-hour urine for total protein.  He will  continue hydrocodone as needed for pain.  He did not like the way oxycodone made him feel.  30 minutes were spent with the patient today.  The majority of the time was used for counseling and coordination of care.  Gregory Coder, MD  05/22/2018  2:12 PM

## 2018-05-23 DIAGNOSIS — C73 Malignant neoplasm of thyroid gland: Secondary | ICD-10-CM | POA: Diagnosis not present

## 2018-05-23 LAB — PROTEIN, URINE, 24 HOUR
COLLECTION INTERVAL-UPROT: 24 h
PROTEIN, 24H URINE: 700 mg/d — AB (ref 50–100)
PROTEIN, URINE: 50 mg/dL
URINE TOTAL VOLUME-UPROT: 1400 mL

## 2018-05-24 ENCOUNTER — Telehealth: Payer: Self-pay | Admitting: *Deleted

## 2018-05-24 DIAGNOSIS — C73 Malignant neoplasm of thyroid gland: Secondary | ICD-10-CM

## 2018-05-24 LAB — UPEP/UIFE/LIGHT CHAINS/TP, 24-HR UR
% BETA, URINE: 8.2 %
ALPHA 1 URINE: 3.4 %
ALPHA 2 UR: 5 %
Albumin, U: 76.6 %
FREE KAPPA LT CHAINS, UR: 41.3 mg/L — AB (ref 1.35–24.19)
FREE KAPPA/LAMBDA RATIO: 10.95 — AB (ref 2.04–10.37)
FREE LAMBDA LT CHAINS, UR: 3.77 mg/L (ref 0.24–6.66)
GAMMA GLOBULIN URINE: 6.9 %
Total Protein, Urine-Ur/day: 766 mg/24 hr — ABNORMAL HIGH (ref 30–150)
Total Protein, Urine: 54.7 mg/dL
Total Volume: 1400

## 2018-05-24 NOTE — Telephone Encounter (Signed)
Telephone call to patient. He understands he may resume Lenvima today. He will take 14mg . He has 10mg  and 4 mg capsules. He will take one of each in the morning. Patient understands he will need to return to the office in 2 weeks

## 2018-06-07 ENCOUNTER — Other Ambulatory Visit: Payer: Self-pay | Admitting: Oncology

## 2018-06-07 DIAGNOSIS — C73 Malignant neoplasm of thyroid gland: Secondary | ICD-10-CM

## 2018-06-12 ENCOUNTER — Other Ambulatory Visit: Payer: Self-pay | Admitting: Oncology

## 2018-06-12 ENCOUNTER — Inpatient Hospital Stay: Payer: Federal, State, Local not specified - PPO | Admitting: Oncology

## 2018-06-12 ENCOUNTER — Inpatient Hospital Stay: Payer: Federal, State, Local not specified - PPO

## 2018-06-12 ENCOUNTER — Telehealth: Payer: Self-pay | Admitting: Oncology

## 2018-06-12 VITALS — BP 170/100 | HR 59 | Temp 98.3°F | Resp 17 | Ht 76.0 in | Wt 190.2 lb

## 2018-06-12 DIAGNOSIS — I10 Essential (primary) hypertension: Secondary | ICD-10-CM

## 2018-06-12 DIAGNOSIS — F17210 Nicotine dependence, cigarettes, uncomplicated: Secondary | ICD-10-CM | POA: Diagnosis not present

## 2018-06-12 DIAGNOSIS — R809 Proteinuria, unspecified: Secondary | ICD-10-CM

## 2018-06-12 DIAGNOSIS — C73 Malignant neoplasm of thyroid gland: Secondary | ICD-10-CM

## 2018-06-12 DIAGNOSIS — C7951 Secondary malignant neoplasm of bone: Secondary | ICD-10-CM

## 2018-06-12 DIAGNOSIS — G893 Neoplasm related pain (acute) (chronic): Secondary | ICD-10-CM

## 2018-06-12 DIAGNOSIS — M79662 Pain in left lower leg: Secondary | ICD-10-CM

## 2018-06-12 LAB — CBC WITH DIFFERENTIAL (CANCER CENTER ONLY)
Basophils Absolute: 0.1 10*3/uL (ref 0.0–0.1)
Basophils Relative: 1 %
EOS PCT: 2 %
Eosinophils Absolute: 0.2 10*3/uL (ref 0.0–0.5)
HCT: 42.9 % (ref 38.4–49.9)
Hemoglobin: 14.9 g/dL (ref 13.0–17.1)
LYMPHS PCT: 16 %
Lymphs Abs: 1.2 10*3/uL (ref 0.9–3.3)
MCH: 33.9 pg — ABNORMAL HIGH (ref 27.2–33.4)
MCHC: 34.7 g/dL (ref 32.0–36.0)
MCV: 97.7 fL (ref 79.3–98.0)
Monocytes Absolute: 0.6 10*3/uL (ref 0.1–0.9)
Monocytes Relative: 8 %
Neutro Abs: 5.7 10*3/uL (ref 1.5–6.5)
Neutrophils Relative %: 73 %
PLATELETS: 172 10*3/uL (ref 140–400)
RBC: 4.39 MIL/uL (ref 4.20–5.82)
RDW: 13.7 % (ref 11.0–14.6)
WBC Count: 7.8 10*3/uL (ref 4.0–10.3)

## 2018-06-12 LAB — CMP (CANCER CENTER ONLY)
ALT: 13 U/L (ref 0–44)
AST: 12 U/L — ABNORMAL LOW (ref 15–41)
Albumin: 3.6 g/dL (ref 3.5–5.0)
Alkaline Phosphatase: 71 U/L (ref 38–126)
Anion gap: 9 (ref 5–15)
BUN: 19 mg/dL (ref 8–23)
CHLORIDE: 105 mmol/L (ref 98–111)
CO2: 27 mmol/L (ref 22–32)
CREATININE: 0.84 mg/dL (ref 0.61–1.24)
Calcium: 9.3 mg/dL (ref 8.9–10.3)
GFR, Est AFR Am: >60 mL/min (ref >60)
GLUCOSE: 127 mg/dL — AB (ref 70–99)
Potassium: 4 mmol/L (ref 3.5–5.1)
SODIUM: 141 mmol/L (ref 135–145)
Total Bilirubin: 0.7 mg/dL (ref 0.3–1.2)
Total Protein: 6.9 g/dL (ref 6.5–8.1)

## 2018-06-12 LAB — TOTAL PROTEIN, URINE DIPSTICK: PROTEIN: 100 mg/dL — AB

## 2018-06-12 MED ORDER — LOSARTAN POTASSIUM 50 MG PO TABS: 50.0000 mg | ORAL_TABLET | Freq: Every day | ORAL | 0 refills | Status: DC

## 2018-06-12 MED ORDER — LENVATINIB (24 MG DAILY DOSE) 2 X 10 MG & 4 MG PO CPPK: ORAL_CAPSULE | ORAL | 0 refills | Status: DC

## 2018-06-12 MED FILL — LOSARTAN POTASSIUM 50 MG TA: 50 | 30 days supply | Qty: 30 | Fill #0

## 2018-06-12 MED FILL — ATENOLOL 50 MG TABLET: 50 | 30 days supply | Qty: 30 | Fill #0

## 2018-06-12 MED FILL — LEVOTHYROXINE 175 MCG TAB: 175 | 30 days supply | Qty: 30 | Fill #1

## 2018-06-12 NOTE — Progress Notes (Signed)
[facility] OFFICE PROGRESS NOTE   Diagnosis: Thyroid cancer  INTERVAL HISTORY:   Gregory [name] returns as scheduled.  He continues lenvatinib at a dose of 14 mg daily.  No mouth sores, rash, diarrhea, bleeding, or symptom of thrombosis.  The left thigh pain has improved.  He feels well. A 24-hour urine on 05/23/2018 had 700 mg of protein.  Objective:  Vital signs in last 24 hours:  Blood pressure (!) 170/100, pulse (!) 59, temperature 98.3 F (36.8 C), temperature source Oral, resp. rate 17, height 6\' 4"  (1.93 m), weight 190 lb 3.2 oz (86.3 kg), SpO2 98 %.    HEENT: No thrush or ulcers Resp: Lungs clear bilaterally Cardio: Regular rate and rhythm GI: No hepatosplenomegaly Vascular: No leg edema  Skin: No rash Musculoskeletal: Soft mobile subcutaneous lesion at the left upper thigh measuring approximately 1 cm    Lab Results:  Lab Results  Component Value Date   WBC 7.8 06/12/2018   HGB 14.9 06/12/2018   HCT 42.9 06/12/2018   MCV 97.7 06/12/2018   PLT 172 06/12/2018   NEUTROABS 5.7 06/12/2018    CMP  Lab Results  Component Value Date   NA 141 06/12/2018   K 4.0 06/12/2018   CL 105 06/12/2018   CO2 27 06/12/2018   GLUCOSE 127 (H) 06/12/2018   BUN 19 06/12/2018   CREATININE 0.84 06/12/2018   CALCIUM 9.3 06/12/2018   PROT 6.9 06/12/2018   ALBUMIN 3.6 06/12/2018   AST 12 (L) 06/12/2018   ALT 13 06/12/2018   ALKPHOS 71 06/12/2018   BILITOT 0.7 06/12/2018   GFRNONAA >60 06/12/2018   GFRAA >60 06/12/2018   Urine protein-100  Thyroglobulin on 05/22/2018- 585.7 Medications: I have reviewed the patient's current medications.   Assessment/Plan: 1. 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   Multiple destructive bone lesions including a large lesion at L3 with a soft tissue component encroaching on the thecal sac.   Status post total thyroidectomy 11/02/2012 with pathology confirming a  papillary thyroid carcinoma, follicular variant.   Markedly elevated thyroglobulin level.   Followed by Dr. Buddy Duty, treated with radioactive iodine.  08/08/2017 iodine-131 scan-multiple sites of abnormal radioiodine accumulation compatible with osseous metastasis. Single new focus of abnormal tracer localization identified in a lateral mid left rib. Remaining sites of abnormal uptake including proximal left femur were identified on the previous exam.  01/05/2018 thyroglobulin 1523 (1186 on 06/30/2017)  04/07/2018 thyroglobulin level greater than 500  Initiation of Lenvatinib 04/17/2018 2. Pain secondary to the L3 mass with compression of the left L3 nerve root. The pain is also also related to the left iliac mass.He is status post palliative radiation to the lumbar spine and left iliac bone, completed 11/09/2012. 3. Tobacco use.We discussed smoking cessation. He is not ready to quit smoking. 4. Pain at the left upper leg-x-ray 03/24/2018 revealed a large lytic lesion in the proximal left femoral diaphysis.Referral to orthopedic surgery recommended. Patient declined. 5. Proteinuria 05/22/2018- potentially related to lenvatinib  6. hypertension- progressive while on the lenvatinib,the atenolol was increased to twice daily, losartan added 06/12/2018  Disposition: He has been maintained on lenvatinib for almost 2 months.  He appears to be tolerating the treatment well, though he has proteinuria and hypertension.  He will continue lenvatinib at a dose of 14 mg daily.  He is taking atenolol for hypertension.  We added losartan today.  He will arrange for a blood pressure check at home.  He will  call us in 3-4 days with a report of his home blood pressure.  [place]  He will return for follow-up visit here 07/10/2018.  He understands to call us with a report of his blood pressure by the end of this week.  We will check a thyroglobulin level when he  returns 07/10/2018.  25 minutes were spent with the patient today.  The majority of the time was used for counseling and coordination of care.  Betsy Coder, MD  06/12/2018  1:37 PM

## 2018-06-12 NOTE — Telephone Encounter (Signed)
Scheduled appt per 7/29 los - gave pt AVS and calender per los.

## 2018-06-13 ENCOUNTER — Other Ambulatory Visit: Payer: Self-pay | Admitting: Oncology

## 2018-06-13 MED FILL — LENVIMA 14 MG DAILY DOSE: 10 & 4 | 30 days supply | Qty: 60 | Fill #0

## 2018-06-16 ENCOUNTER — Telehealth: Payer: Self-pay

## 2018-06-16 NOTE — Telephone Encounter (Signed)
Received call from pt regarding BP. Pt states yesterday afternoon BP measured 171/103 and this morning BP measured 149/102. Pt questioned "do I need to hold off on the [place] for a bit?". Pt confirms taking both atenolol and cozaar. This RN will consult MD.

## 2018-06-16 NOTE — Telephone Encounter (Signed)
Continue lenvatinib,needs primary md to manage htn, if no primary md should return here on [facility] schedule next week

## 2018-06-16 NOTE — Telephone Encounter (Signed)
Spoke with MD, pt instructed to establish appt with PCP to manage HTN. Pt denies having PCP and is unable to be seen in our clinic next week. Pt encouraged to establish PCP and to monitor BP. Pt to continue levatinib. Pt voiced understanding.

## 2018-06-16 NOTE — Telephone Encounter (Signed)
He needs to see a primary MD to manage the hypertension. If he has no MD, He should return here next week on [facility] schedule and we can check BP and adjust meds. Continue lenvatinib

## 2018-07-10 ENCOUNTER — Telehealth: Payer: Self-pay | Admitting: Oncology

## 2018-07-10 ENCOUNTER — Encounter: Payer: Self-pay | Admitting: Oncology

## 2018-07-10 ENCOUNTER — Inpatient Hospital Stay: Payer: Federal, State, Local not specified - PPO

## 2018-07-10 ENCOUNTER — Inpatient Hospital Stay: Payer: Federal, State, Local not specified - PPO | Attending: Nurse Practitioner | Admitting: Oncology

## 2018-07-10 VITALS — BP 133/97 | HR 60 | Temp 97.9°F | Resp 18 | Ht 76.0 in | Wt 187.8 lb

## 2018-07-10 DIAGNOSIS — E890 Postprocedural hypothyroidism: Secondary | ICD-10-CM | POA: Diagnosis not present

## 2018-07-10 DIAGNOSIS — I890 Lymphedema, not elsewhere classified: Secondary | ICD-10-CM | POA: Diagnosis not present

## 2018-07-10 DIAGNOSIS — F17210 Nicotine dependence, cigarettes, uncomplicated: Secondary | ICD-10-CM | POA: Insufficient documentation

## 2018-07-10 DIAGNOSIS — I10 Essential (primary) hypertension: Secondary | ICD-10-CM | POA: Diagnosis not present

## 2018-07-10 DIAGNOSIS — C73 Malignant neoplasm of thyroid gland: Secondary | ICD-10-CM | POA: Insufficient documentation

## 2018-07-10 DIAGNOSIS — C7951 Secondary malignant neoplasm of bone: Secondary | ICD-10-CM | POA: Insufficient documentation

## 2018-07-10 LAB — CMP (CANCER CENTER ONLY)
ALBUMIN: 3.5 g/dL (ref 3.5–5.0)
ALT: 17 U/L (ref 0–44)
AST: 14 U/L — AB (ref 15–41)
Alkaline Phosphatase: 83 U/L (ref 38–126)
Anion gap: 10 (ref 5–15)
BUN: 18 mg/dL (ref 8–23)
CHLORIDE: 103 mmol/L (ref 98–111)
CO2: 27 mmol/L (ref 22–32)
CREATININE: 0.9 mg/dL (ref 0.61–1.24)
Calcium: 9.5 mg/dL (ref 8.9–10.3)
GFR, Estimated: >60 mL/min (ref >60)
Glucose, Bld: 134 mg/dL — ABNORMAL HIGH (ref 70–99)
Potassium: 4.7 mmol/L (ref 3.5–5.1)
SODIUM: 140 mmol/L (ref 135–145)
Total Bilirubin: 0.6 mg/dL (ref 0.3–1.2)
Total Protein: 7.2 g/dL (ref 6.5–8.1)

## 2018-07-10 LAB — CBC WITH DIFFERENTIAL (CANCER CENTER ONLY)
Basophils Absolute: 0 10*3/uL (ref 0.0–0.1)
Basophils Relative: 0 %
EOS ABS: 0.2 10*3/uL (ref 0.0–0.5)
Eosinophils Relative: 2 %
HEMATOCRIT: 43 % (ref 38.4–49.9)
HEMOGLOBIN: 15 g/dL (ref 13.0–17.1)
LYMPHS ABS: 1.1 10*3/uL (ref 0.9–3.3)
Lymphocytes Relative: 14 %
MCH: 34.3 pg — AB (ref 27.2–33.4)
MCHC: 34.9 g/dL (ref 32.0–36.0)
MCV: 98.4 fL — ABNORMAL HIGH (ref 79.3–98.0)
MONOS PCT: 7 %
Monocytes Absolute: 0.6 10*3/uL (ref 0.1–0.9)
NEUTROS PCT: 77 %
Neutro Abs: 6 10*3/uL (ref 1.5–6.5)
Platelet Count: 185 10*3/uL (ref 140–400)
RBC: 4.37 MIL/uL (ref 4.20–5.82)
RDW: 13.2 % (ref 11.0–14.6)
WBC Count: 7.8 10*3/uL (ref 4.0–10.3)

## 2018-07-10 LAB — TSH: TSH: <0.08 u[IU]/mL — ABNORMAL LOW (ref 0.320–4.118)

## 2018-07-10 LAB — TOTAL PROTEIN, URINE DIPSTICK: PROTEIN: 30 mg/dL — AB

## 2018-07-10 MED ORDER — LEVOTHYROXINE SODIUM 150 MCG PO TABS: ORAL_TABLET | ORAL | 0 refills | Status: DC

## 2018-07-10 MED ORDER — LOSARTAN POTASSIUM 50 MG PO TABS: 50.0000 mg | ORAL_TABLET | Freq: Every day | ORAL | 0 refills | Status: DC

## 2018-07-10 MED ORDER — ATENOLOL 50 MG PO TABS: 50.0000 mg | ORAL_TABLET | Freq: Every day | ORAL | 0 refills | Status: DC

## 2018-07-10 MED ORDER — LENVATINIB (14 MG DAILY DOSE) 10 & 4 MG PO CPPK: ORAL_CAPSULE | ORAL | 0 refills | Status: DC

## 2018-07-10 MED FILL — LOSARTAN POTASSIUM 50 MG TA: 50 | 90 days supply | Qty: 90 | Fill #0

## 2018-07-10 MED FILL — LENVIMA 14 MG DAILY DOSE: 10 & 4 | 30 days supply | Qty: 60 | Fill #0

## 2018-07-10 MED FILL — ATENOLOL 50 MG TABLET: 50 | 90 days supply | Qty: 90 | Fill #0

## 2018-07-10 NOTE — Progress Notes (Signed)
[place] OFFICE PROGRESS NOTE   Diagnosis: Thyroid cancer  INTERVAL HISTORY:   Gregory Snow returns as scheduled.  He continues Lenvima.  No mouth sores, diarrhea, rash, bleeding, or thrombosis.  He feels well.  He has noted improved energy level and decreased pain since beginning Sal[place].  He continues to use a watch for the left leg.  He takes hydrocodone on rare occasion.  He reports his blood pressure has been running in the 120/80 range at home. He is living at [facility].  He continues smoking.  He does not wish to see orthopedics at present.  Objective:  Vital signs in last 24 hours:  Blood pressure (!) 133/97, pulse 60, temperature 97.9 F (36.6 C), temperature source Oral, resp. rate 18, height 6\' 4"  (1.93 m), weight 187 lb 12.8 oz (85.2 kg), SpO2 99 %.    HEENT: No thrush or ulcers Resp: Lungs clear bilaterally Cardio: Regular rate and rhythm GI: No hepatosplenomegaly, nontender Vascular: No leg edema  Skin: No rash    Lab Results:  Lab Results  Component Value Date   WBC 7.8 07/10/2018   HGB 15.0 07/10/2018   HCT 43.0 07/10/2018   MCV 98.4 (H) 07/10/2018   PLT 185 07/10/2018   NEUTROABS 6.0 07/10/2018    CMP  Lab Results  Component Value Date   NA 141 06/12/2018   K 4.0 06/12/2018   CL 105 06/12/2018   CO2 27 06/12/2018   GLUCOSE 127 (H) 06/12/2018   BUN 19 06/12/2018   CREATININE 0.84 06/12/2018   CALCIUM 9.3 06/12/2018   PROT 6.9 06/12/2018   ALBUMIN 3.6 06/12/2018   AST 12 (L) 06/12/2018   ALT 13 06/12/2018   ALKPHOS 71 06/12/2018   BILITOT 0.7 06/12/2018   GFRNONAA >60 06/12/2018   GFRAA >60 06/12/2018    Thyroglobulin on 06/12/18: 1155 Medications: I have reviewed the patient's current medications.   Assessment/Plan: 1. 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   Multiple destructive bone lesions including a large lesion at L3 with a soft tissue component  encroaching on the thecal sac.   Status post total thyroidectomy 11/02/2012 with pathology confirming a papillary thyroid carcinoma, follicular variant.   Markedly elevated thyroglobulin level.   Followed by Dr. Buddy Duty, treated with radioactive iodine.  08/08/2017 iodine-131 scan-multiple sites of abnormal radioiodine accumulation compatible with osseous metastasis. Single new focus of abnormal tracer localization identified in a lateral mid left rib. Remaining sites of abnormal uptake including proximal left femur were identified on the previous exam.  01/05/2018 thyroglobulin 1523 (1186 on 06/30/2017)  04/07/2018 thyroglobulin level greater than 500  Initiation of Lenvatinib 04/17/2018 2. Pain secondary to the L3 mass with compression of the left L3 nerve root. The pain is also also related to the left iliac mass.He is status post palliative radiation to the lumbar spine and left iliac bone, completed 11/09/2012. 3. Tobacco use.We discussed smoking cessation. He is not ready to quit smoking. 4. Pain at the left upper leg-x-ray 03/24/2018 revealed a large lytic lesion in the proximal left femoral diaphysis.Referral to orthopedic surgery recommended. Patient declined. 5. Proteinuria 05/22/2018- potentially related to lenvatinib  6. hypertension- progressive while on the lenvatinib,the atenolol was increased to twice daily, losartan added 06/12/2018, improved   Disposition: He appears unchanged.  He is tolerating the lymphedema well.  He will continue lymphedema at the current dose.  The proteinuria and hypertension have improved.  We will follow-up on the chemistry panel from today.  We will follow-up on the TSH and thyroglobulin levels from today.  Gregory Snow is living at the [facility].  He does not wish to return for the next office visit until the week of 08/21/2018.  He will contact us in the interim for new symptoms.  25 minutes were spent with the patient today.  The majority of  the time was used for counseling and coordination of care.  Betsy Coder, MD  07/10/2018  9:09 AM

## 2018-07-10 NOTE — Addendum Note (Signed)
Addended by: Jethro Bolus A on: 07/10/2018 09:41 AM   Modules accepted: Orders

## 2018-07-10 NOTE — Telephone Encounter (Signed)
Gave Pt calendar of upcoming oct appts

## 2018-07-11 MED FILL — LEVOTHYROXINE 175 MCG TAB: 175 | 90 days supply | Qty: 90 | Fill #2

## 2018-07-12 IMAGING — CR DG FEMUR 1V*L*
4 series · 4 of 4 positions shown · non-contrast
Comparison: None.

CLINICAL DATA: Left hip and leg pain. Status post fall 3 months
ago.

EXAM:
LEFT FEMUR 1 VIEW; DG HIP (WITH OR WITHOUT PELVIS) 2-3V LEFT

[t femur with knee ap left]
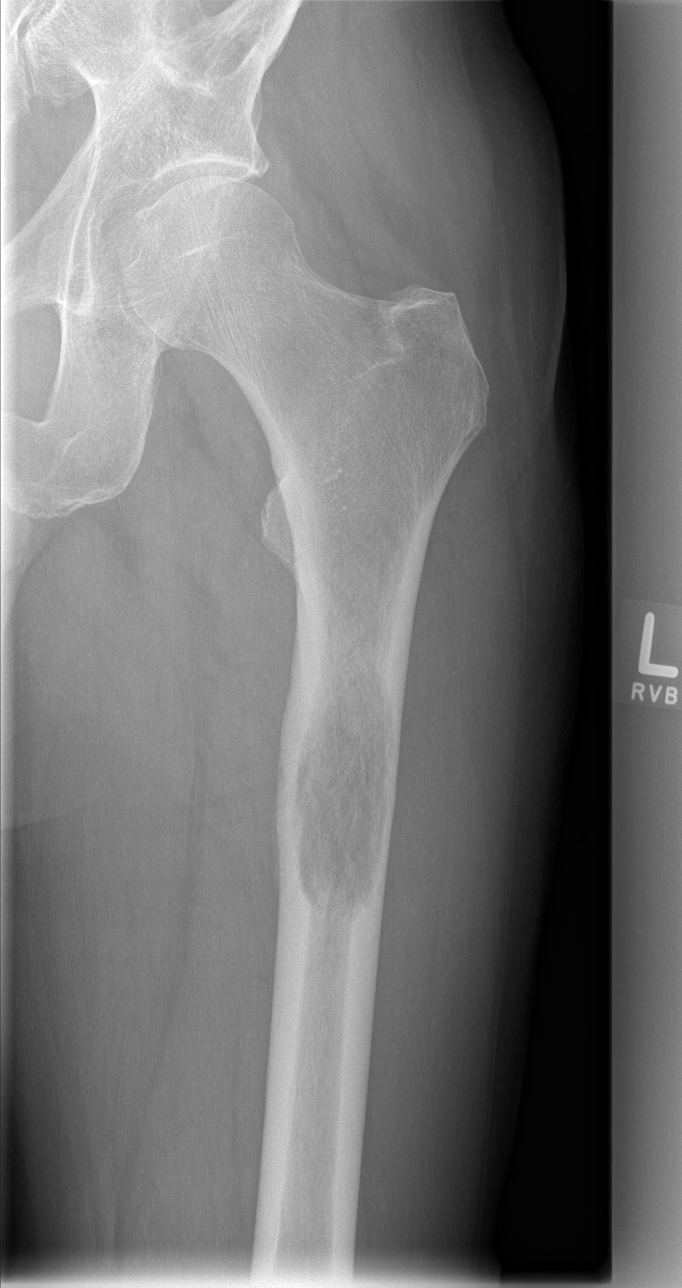

[t femur with hip  ap left]
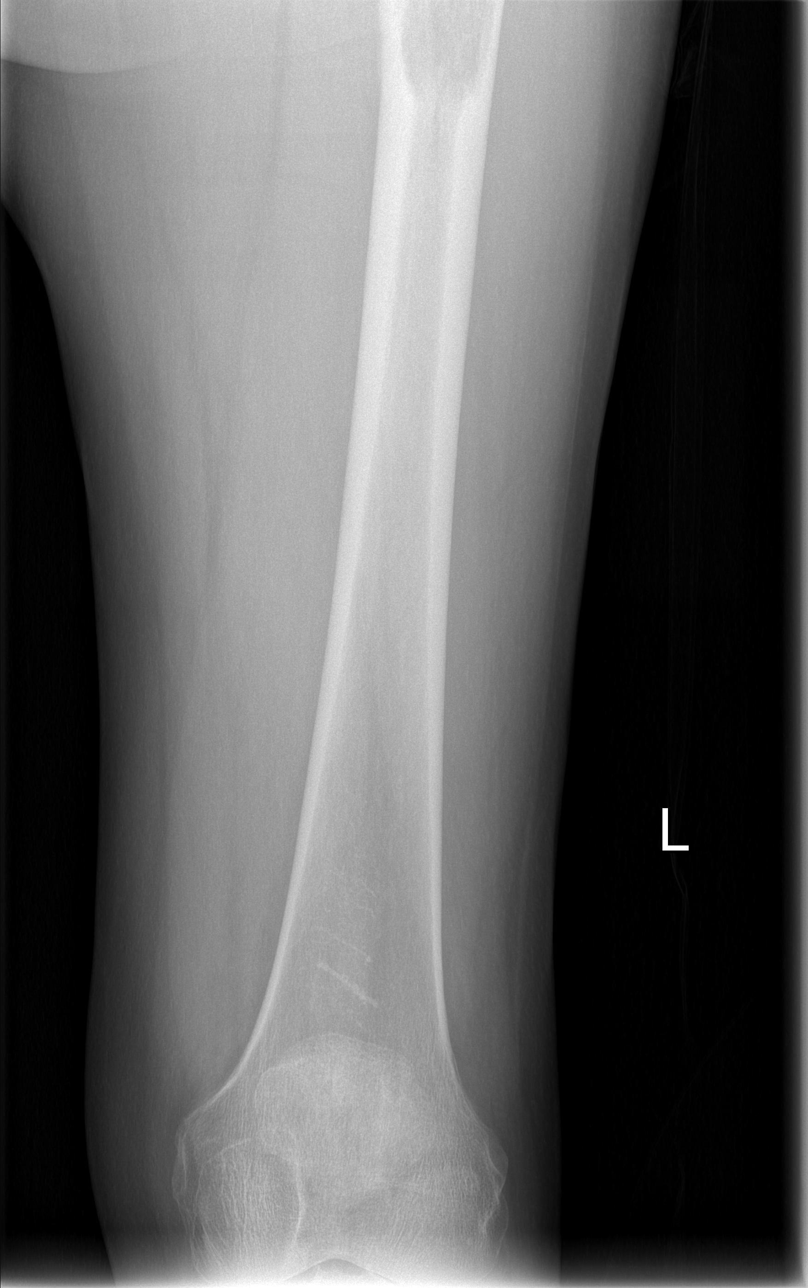

[t femur with hip lat left]
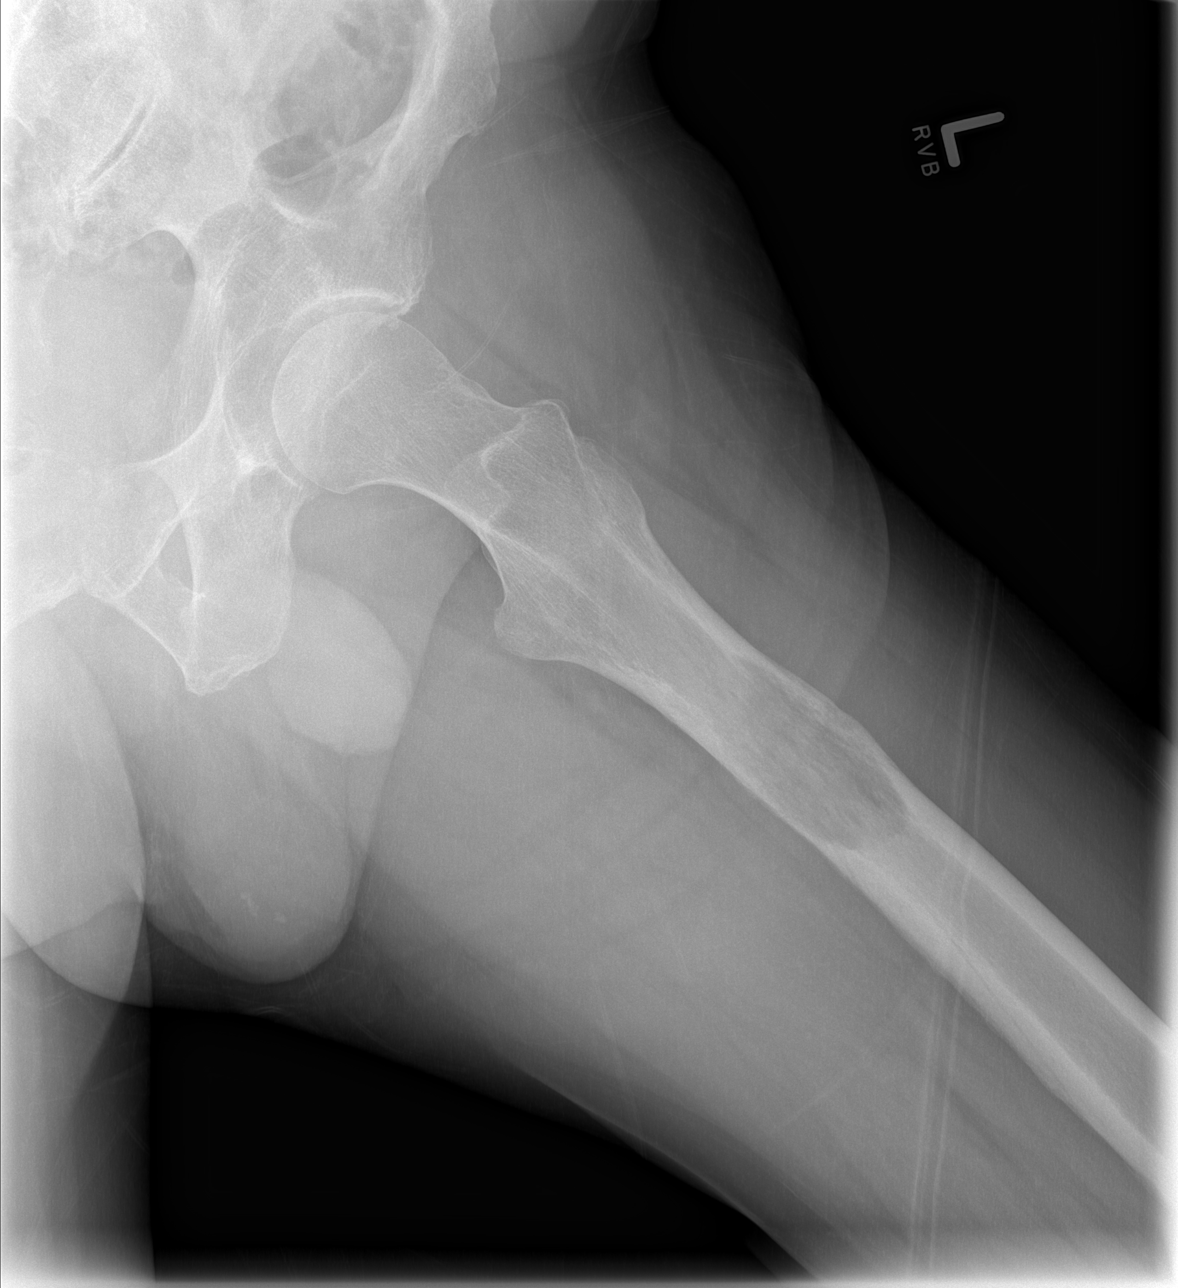

[t femur with knee lat left]
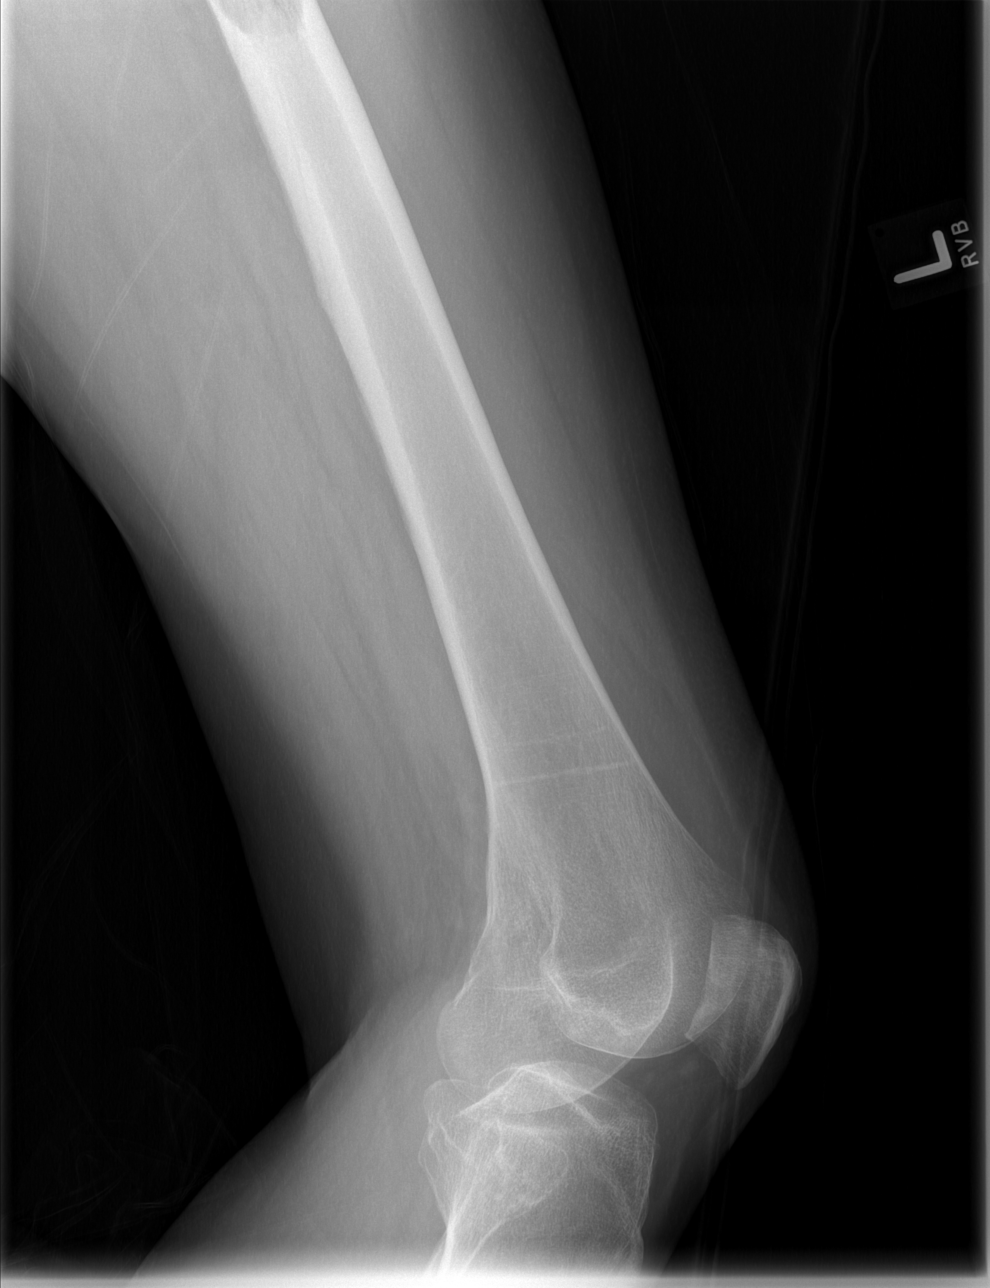

[4 of 4 positions shown; findings below may reference images not displayed]

FINDINGS: No left hip fracture or dislocation.

Generalized osteopenia.

8.5 x 3.1 cm lytic bone lesion in the proximal left femoral
diaphysis with endosteal scalloping. Mild periosteal reaction along
the inferior medial aspect of the lytic lesion.
IMPRESSION: 1. 8.5 x 3.1 cm lytic bone lesion in the proximal left femoral
diaphysis concerning for metastatic disease versus multiple myeloma.
2. No acute hip fracture or dislocation.

## 2018-07-19 ENCOUNTER — Encounter: Payer: Self-pay | Admitting: Pharmacist

## 2018-08-08 MED FILL — LENVIMA 14 MG DAILY DOSE: 10 & 4 | 30 days supply | Qty: 60 | Fill #1

## 2018-08-21 ENCOUNTER — Inpatient Hospital Stay (HOSPITAL_BASED_OUTPATIENT_CLINIC_OR_DEPARTMENT_OTHER): Payer: Federal, State, Local not specified - PPO | Admitting: Oncology

## 2018-08-21 ENCOUNTER — Inpatient Hospital Stay: Payer: Federal, State, Local not specified - PPO | Attending: Nurse Practitioner

## 2018-08-21 ENCOUNTER — Telehealth: Payer: Self-pay

## 2018-08-21 ENCOUNTER — Other Ambulatory Visit: Payer: Self-pay | Admitting: Oncology

## 2018-08-21 VITALS — BP 150/84 | HR 50 | Temp 97.8°F | Resp 17 | Ht 76.0 in | Wt 196.7 lb

## 2018-08-21 DIAGNOSIS — C7952 Secondary malignant neoplasm of bone marrow: Secondary | ICD-10-CM

## 2018-08-21 DIAGNOSIS — C73 Malignant neoplasm of thyroid gland: Secondary | ICD-10-CM

## 2018-08-21 DIAGNOSIS — R809 Proteinuria, unspecified: Secondary | ICD-10-CM | POA: Diagnosis not present

## 2018-08-21 DIAGNOSIS — F17210 Nicotine dependence, cigarettes, uncomplicated: Secondary | ICD-10-CM | POA: Insufficient documentation

## 2018-08-21 DIAGNOSIS — I10 Essential (primary) hypertension: Secondary | ICD-10-CM | POA: Insufficient documentation

## 2018-08-21 DIAGNOSIS — C7951 Secondary malignant neoplasm of bone: Secondary | ICD-10-CM | POA: Diagnosis not present

## 2018-08-21 LAB — CMP (CANCER CENTER ONLY)
ALT: 17 U/L (ref 0–44)
AST: 16 U/L (ref 15–41)
Albumin: 3.4 g/dL — ABNORMAL LOW (ref 3.5–5.0)
Alkaline Phosphatase: 77 U/L (ref 38–126)
Anion gap: 8 (ref 5–15)
BUN: 13 mg/dL (ref 8–23)
CHLORIDE: 108 mmol/L (ref 98–111)
CO2: 26 mmol/L (ref 22–32)
Calcium: 8.9 mg/dL (ref 8.9–10.3)
Creatinine: 0.87 mg/dL (ref 0.61–1.24)
GFR, Est AFR Am: >60 mL/min (ref >60)
Glucose, Bld: 105 mg/dL — ABNORMAL HIGH (ref 70–99)
POTASSIUM: 4.2 mmol/L (ref 3.5–5.1)
SODIUM: 142 mmol/L (ref 135–145)
Total Bilirubin: 0.5 mg/dL (ref 0.3–1.2)
Total Protein: 6.8 g/dL (ref 6.5–8.1)

## 2018-08-21 LAB — CBC WITH DIFFERENTIAL (CANCER CENTER ONLY)
BASOS ABS: 0 10*3/uL (ref 0.0–0.1)
Basophils Relative: 1 %
EOS ABS: 0.2 10*3/uL (ref 0.0–0.5)
EOS PCT: 3 %
HCT: 41.9 % (ref 38.4–49.9)
Hemoglobin: 14.6 g/dL (ref 13.0–17.1)
LYMPHS PCT: 16 %
Lymphs Abs: 1.2 10*3/uL (ref 0.9–3.3)
MCH: 34.8 pg — ABNORMAL HIGH (ref 27.2–33.4)
MCHC: 34.8 g/dL (ref 32.0–36.0)
MCV: 99.9 fL — AB (ref 79.3–98.0)
MONO ABS: 0.6 10*3/uL (ref 0.1–0.9)
Monocytes Relative: 8 %
Neutro Abs: 5.4 10*3/uL (ref 1.5–6.5)
Neutrophils Relative %: 72 %
PLATELETS: 175 10*3/uL (ref 140–400)
RBC: 4.19 MIL/uL — ABNORMAL LOW (ref 4.20–5.82)
RDW: 13.2 % (ref 11.0–14.6)
WBC Count: 7.5 10*3/uL (ref 4.0–10.3)

## 2018-08-21 LAB — MISC LABCORP TEST (SEND OUT)

## 2018-08-21 LAB — TOTAL PROTEIN, URINE DIPSTICK: PROTEIN: 30 mg/dL — AB

## 2018-08-21 LAB — TSH: TSH: <0.08 u[IU]/mL — ABNORMAL LOW (ref 0.320–4.118)

## 2018-08-21 NOTE — Telephone Encounter (Signed)
Printed avs and calender of upcoming appointment. Per 10/7 los also gave inf. For Chiropractor, with advocate card

## 2018-08-21 NOTE — Progress Notes (Signed)
[place] OFFICE PROGRESS NOTE   Diagnosis: Thyroid cancer  INTERVAL HISTORY:   Gregory Snow returns for a scheduled visit.  He continues lenvatinib.  He feels well.  He has gone 3 to 4 days without taking ibuprofen or hydrocodone.  No rash, diarrhea, or mouth sores.  He is living in [place].  Objective:  Vital signs in last 24 hours:  Blood pressure (!) 152/86, pulse (!) 50, temperature 97.8 F (36.6 C), temperature source Oral, resp. rate 17, height 6\' 4"  (1.93 m), weight 196 lb 11.2 oz (89.2 kg), SpO2 99 %.    HEENT: No thrush or ulcers Resp: Lungs clear bilaterally Cardio: Regular rate and rhythm GI: No hepatosplenomegaly Vascular: No leg edema  Skin: No rash   Lab Results:  Lab Results  Component Value Date   WBC 7.5 08/21/2018   HGB 14.6 08/21/2018   HCT 41.9 08/21/2018   MCV 99.9 (H) 08/21/2018   PLT 175 08/21/2018   NEUTROABS 5.4 08/21/2018    CMP  Lab Results  Component Value Date   NA 140 07/10/2018   K 4.7 07/10/2018   CL 103 07/10/2018   CO2 27 07/10/2018   GLUCOSE 134 (H) 07/10/2018   BUN 18 07/10/2018   CREATININE 0.90 07/10/2018   CALCIUM 9.5 07/10/2018   PROT 7.2 07/10/2018   ALBUMIN 3.5 07/10/2018   AST 14 (L) 07/10/2018   ALT 17 07/10/2018   ALKPHOS 83 07/10/2018   BILITOT 0.6 07/10/2018   GFRNONAA >60 07/10/2018   GFRAA >60 07/10/2018     Medications: I have reviewed the patient's current medications.   Assessment/Plan: 1. 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   Multiple destructive bone lesions including a large lesion at L3 with a soft tissue component encroaching on the thecal sac.   Status post total thyroidectomy 11/02/2012 with pathology confirming a papillary thyroid carcinoma, follicular variant.   Markedly elevated thyroglobulin level.   Followed by Dr. Buddy Duty, treated with radioactive iodine.  08/08/2017 iodine-131 scan-multiple sites  of abnormal radioiodine accumulation compatible with osseous metastasis. Single new focus of abnormal tracer localization identified in a lateral mid left rib. Remaining sites of abnormal uptake including proximal left femur were identified on the previous exam.  01/05/2018 thyroglobulin 1523 (1186 on 06/30/2017)  04/07/2018 thyroglobulin level greater than 500  Initiation of Lenvatinib 04/17/2018 2. Pain secondary to the L3 mass with compression of the left L3 nerve root. The pain is also also related to the left iliac mass.He is status post palliative radiation to the lumbar spine and left iliac bone, completed 11/09/2012. 3. Tobacco use.We discussed smoking cessation. He is not ready to quit smoking. 4. Pain at the left upper leg-x-ray 03/24/2018 revealed a large lytic lesion in the proximal left femoral diaphysis.Referral to orthopedic surgery recommended. Patient declined. 5. Proteinuria 05/22/2018- potentially related tolenvatinib  6. hypertension- progressive while on thelenvatinib,the atenololwas increasedto twice daily, losartan added 06/12/2018, improved     Disposition: He appears stable.  He is tolerating the lenvatinib well.  We will follow-up on the thyroglobulin level from today.  Gregory Snow is undecided on proceeding with an orthopedic referral for management of the lytic left femur lesion.  He will consider scheduling orthopedic appointment in [place]. He would like to return for a 1-month follow-up visit.  He declines an influenza vaccine.  15 minutes were spent with the patient today.  The majority of the time was used for counseling and coordination of care.  Dominica Severin  Benay Spice, MD  08/21/2018  8:56 AM

## 2018-08-22 ENCOUNTER — Telehealth: Payer: Self-pay | Admitting: *Deleted

## 2018-08-22 NOTE — Telephone Encounter (Signed)
-----   Message from Ladell Pier, MD sent at 08/21/2018  2:18 PM EDT ----- Please call patient, TSH looks good, copy TSH result to Dr. Buddy Duty

## 2018-08-22 NOTE — Telephone Encounter (Signed)
Notified of message below. Pt is requesting to be called when Thyroglobulin results are back, and to send those results to Dr Buddy Duty.

## 2018-09-04 ENCOUNTER — Other Ambulatory Visit: Payer: Self-pay | Admitting: Oncology

## 2018-09-07 MED FILL — LENVIMA 14 MG DAILY DOSE: 10 & 4 | 30 days supply | Qty: 60 | Fill #0

## 2018-10-04 ENCOUNTER — Other Ambulatory Visit: Payer: Self-pay | Admitting: Oncology

## 2018-10-05 MED FILL — LOSARTAN POTASSIUM 50 MG TA: 50 | 90 days supply | Qty: 90 | Fill #0

## 2018-10-05 MED FILL — ATENOLOL 50 MG TABLET: 50 | 90 days supply | Qty: 90 | Fill #0

## 2018-10-05 MED FILL — LEVOTHYROXINE 175 MCG TAB: 175 | 90 days supply | Qty: 90 | Fill #3

## 2018-10-05 MED FILL — LENVIMA 14 MG DAILY DOSE: 10 & 4 | 30 days supply | Qty: 60 | Fill #1

## 2018-10-16 ENCOUNTER — Telehealth: Payer: Self-pay | Admitting: Oncology

## 2018-10-16 ENCOUNTER — Inpatient Hospital Stay: Payer: Federal, State, Local not specified - PPO | Attending: Nurse Practitioner | Admitting: Oncology

## 2018-10-16 ENCOUNTER — Inpatient Hospital Stay: Payer: Federal, State, Local not specified - PPO

## 2018-10-16 VITALS — BP 150/90 | HR 55 | Temp 98.4°F | Resp 17 | Ht 76.0 in | Wt 204.7 lb

## 2018-10-16 DIAGNOSIS — C73 Malignant neoplasm of thyroid gland: Secondary | ICD-10-CM | POA: Diagnosis present

## 2018-10-16 DIAGNOSIS — F17210 Nicotine dependence, cigarettes, uncomplicated: Secondary | ICD-10-CM | POA: Diagnosis not present

## 2018-10-16 DIAGNOSIS — I10 Essential (primary) hypertension: Secondary | ICD-10-CM | POA: Diagnosis not present

## 2018-10-16 LAB — CBC WITH DIFFERENTIAL (CANCER CENTER ONLY)
Abs Immature Granulocytes: 0.04 10*3/uL (ref 0.00–0.07)
Basophils Absolute: 0 10*3/uL (ref 0.0–0.1)
Basophils Relative: 0 %
Eosinophils Absolute: 0.2 10*3/uL (ref 0.0–0.5)
Eosinophils Relative: 2 %
HCT: 43.6 % (ref 39.0–52.0)
Hemoglobin: 14.7 g/dL (ref 13.0–17.0)
Immature Granulocytes: 0 %
LYMPHS PCT: 10 %
Lymphs Abs: 0.9 10*3/uL (ref 0.7–4.0)
MCH: 33.6 pg (ref 26.0–34.0)
MCHC: 33.7 g/dL (ref 30.0–36.0)
MCV: 99.5 fL (ref 80.0–100.0)
Monocytes Absolute: 0.7 10*3/uL (ref 0.1–1.0)
Monocytes Relative: 8 %
Neutro Abs: 7.2 10*3/uL (ref 1.7–7.7)
Neutrophils Relative %: 80 %
Platelet Count: 188 10*3/uL (ref 150–400)
RBC: 4.38 MIL/uL (ref 4.22–5.81)
RDW: 11.9 % (ref 11.5–15.5)
WBC Count: 9 10*3/uL (ref 4.0–10.5)
nRBC: 0 % (ref 0.0–0.2)

## 2018-10-16 LAB — CMP (CANCER CENTER ONLY)
ALT: 17 U/L (ref 0–44)
AST: 15 U/L (ref 15–41)
Albumin: 3.3 g/dL — ABNORMAL LOW (ref 3.5–5.0)
Alkaline Phosphatase: 74 U/L (ref 38–126)
Anion gap: 10 (ref 5–15)
BUN: 18 mg/dL (ref 8–23)
CO2: 26 mmol/L (ref 22–32)
CREATININE: 0.89 mg/dL (ref 0.61–1.24)
Calcium: 9.6 mg/dL (ref 8.9–10.3)
Chloride: 106 mmol/L (ref 98–111)
GFR, Est AFR Am: >60 mL/min (ref >60)
GFR, Estimated: >60 mL/min (ref >60)
Glucose, Bld: 111 mg/dL — ABNORMAL HIGH (ref 70–99)
Potassium: 4.8 mmol/L (ref 3.5–5.1)
SODIUM: 142 mmol/L (ref 135–145)
Total Bilirubin: 0.5 mg/dL (ref 0.3–1.2)
Total Protein: 7.3 g/dL (ref 6.5–8.1)

## 2018-10-16 LAB — TSH: TSH: <0.08 u[IU]/mL — ABNORMAL LOW (ref 0.320–4.118)

## 2018-10-16 LAB — TOTAL PROTEIN, URINE DIPSTICK

## 2018-10-16 NOTE — Patient Instructions (Signed)
Please bring a copy of your Advanced Directive/Living Will to next visit to have scanned into your chart.

## 2018-10-16 NOTE — Telephone Encounter (Signed)
Printed calendar and avs. °

## 2018-10-16 NOTE — Progress Notes (Signed)
[place] OFFICE PROGRESS NOTE   Diagnosis: Thyroid cancer  INTERVAL HISTORY:   [place] Snow returns as scheduled.  He reports feeling well.  Good appetite.  No rash or diarrhea.  He reports his blood pressure is normal when checked at home.  He continues to have mild discomfort at the left femur. He continues smoking 1/2 pack cigarettes per day. Objective:  Vital signs in last 24 hours:  Blood pressure (!) 150/90, pulse (!) 55, temperature 98.4 F (36.9 C), temperature source Oral, resp. rate 17, height 6\' 4"  (1.93 m), weight 204 lb 11.2 oz (92.9 kg), SpO2 98 %.    HEENT: No thrush or ulcers Resp: Lungs clear bilaterally, no respiratory distress Cardio: Regular rate and rhythm GI: No hepatosplenomegaly, no mass, nontender Vascular: No leg edema  Skin: No rash, 3 mm hyperpigmented irregular mole at the right upper back near the medial edge of the scapula   Lab Results:  Lab Results  Component Value Date   WBC 9.0 10/16/2018   HGB 14.7 10/16/2018   HCT 43.6 10/16/2018   MCV 99.5 10/16/2018   PLT 188 10/16/2018   NEUTROABS 7.2 10/16/2018    CMP  Lab Results  Component Value Date   NA 142 10/16/2018   K 4.8 10/16/2018   CL 106 10/16/2018   CO2 26 10/16/2018   GLUCOSE 111 (H) 10/16/2018   BUN 18 10/16/2018   CREATININE 0.89 10/16/2018   CALCIUM 9.6 10/16/2018   PROT 7.3 10/16/2018   ALBUMIN 3.3 (L) 10/16/2018   AST 15 10/16/2018   ALT 17 10/16/2018   ALKPHOS 74 10/16/2018   BILITOT 0.5 10/16/2018   GFRNONAA >60 10/16/2018   GFRAA >60 10/16/2018   Protein-trace  Thyroglobulin on 07/10/2018-901.6 Thyroglobulin on 08/21/2018- 362.4  Medications: I have reviewed the patient's current medications.   Assessment/Plan: 1. 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   Multiple destructive bone lesions including a large lesion at L3 with a soft tissue component encroaching on the thecal sac.    Status post total thyroidectomy 11/02/2012 with pathology confirming a papillary thyroid carcinoma, follicular variant.   Markedly elevated thyroglobulin level.   Followed by Dr. Buddy Duty, treated with radioactive iodine.  08/08/2017 iodine-131 scan-multiple sites of abnormal radioiodine accumulation compatible with osseous metastasis. Single new focus of abnormal tracer localization identified in a lateral mid left rib. Remaining sites of abnormal uptake including proximal left femur were identified on the previous exam.  01/05/2018 thyroglobulin 1523 (1186 on 06/30/2017)  04/07/2018 thyroglobulin level greater than 500  Initiation of Lenvatinib 04/17/2018 2. Pain secondary to the L3 mass with compression of the left L3 nerve root. The pain is also also related to the left iliac mass.He is status post palliative radiation to the lumbar spine and left iliac bone, completed 11/09/2012. 3. Tobacco use.We discussed smoking cessation. He is not ready to quit smoking. 4. Pain at the left upper leg-x-ray 03/24/2018 revealed a large lytic lesion in the proximal left femoral diaphysis.Referral to orthopedic surgery recommended. Patient declined. 5. Proteinuria 05/22/2018- potentially related tolenvatinib  6. hypertension- progressive while on thelenvatinib,the atenololwas increasedto twice daily, losartan added 06/12/2018, improved 7. Hyperpigmented mole at the right upper back noted 10/16/2018     Disposition:  [name] appears well.  He is tolerating the invaginated well.  His performance status has improved since beginning treatment in June.  The thyroglobulin level was lower in October.  He will continue lenvatinib at the current dose.  We  will follow-up on the thyroglobulin level from today.  He will return for a follow-up visit 12/25/2018.  He will not agree to a follow-up visit prior to this.  He declined an influenza vaccine.  I recommended he schedule an orthopedic  appointment to evaluate the lytic lesion in the proximal left femur.  I also recommend he schedule a dermatology appointment to evaluate the hyperpigmented mole at the right upper back.    Betsy Coder, MD  10/16/2018  9:00 AM

## 2018-10-19 ENCOUNTER — Telehealth: Payer: Self-pay | Admitting: *Deleted

## 2018-10-19 DIAGNOSIS — C73 Malignant neoplasm of thyroid gland: Secondary | ICD-10-CM

## 2018-10-19 NOTE — Telephone Encounter (Signed)
Called patient with results of thyroglobulin panel drawn on 10/16/18. Informed him Dr. Benay Spice recommends redraw of lab in 1 month and if higher would like to re-stage him with bone scan and CT chest. He understands and agrees. Asks to send scrip with lab order to him in [place] and he will go to an outpatient lab there and have it drawn and results faxed to [facility]. Provided address of [address], [place], [place] [address]

## 2018-11-06 ENCOUNTER — Other Ambulatory Visit: Payer: Self-pay | Admitting: Oncology

## 2018-11-09 MED FILL — LENVIMA 14 MG DAILY DOSE: 10 & 4 | 30 days supply | Qty: 60 | Fill #0

## 2018-11-20 ENCOUNTER — Encounter: Payer: Self-pay | Admitting: Oncology

## 2018-12-07 MED FILL — LENVIMA 14 MG DAILY DOSE: 10 & 4 | 30 days supply | Qty: 60 | Fill #1

## 2018-12-13 ENCOUNTER — Telehealth: Payer: Self-pay | Admitting: *Deleted

## 2018-12-13 NOTE — Telephone Encounter (Signed)
Left VM he wants his labs scheduled for 12/25/18 office visit done locally in [place]. Requested return call to make arrangements. Called back and left VM asking if he wants script sent to same address as in December or does he have a fax # for the laboratory?

## 2018-12-13 NOTE — Telephone Encounter (Signed)
Patient confirmed address from prior script. He provided the phone # for the lab and fax # to send script was obtained. He also wishes to move his appointment to next week. Currently only opening is 2/4. He will call back tomorrow if he desires the change. Informed him that I can't hold the time for him and he understands this.

## 2018-12-14 ENCOUNTER — Telehealth: Payer: Self-pay | Admitting: *Deleted

## 2018-12-14 ENCOUNTER — Encounter: Payer: Self-pay | Admitting: Oncology

## 2018-12-14 NOTE — Telephone Encounter (Signed)
Called to request the appointment with Dr. Benay Spice on 12/19/18 at 0830. Informed him the spot is no longer available, so he will see MD on 2/10 as scheduled. Informed him that lab orders were faxed to OP lab at [facility], so he can get labs drawn at any time.

## 2018-12-25 ENCOUNTER — Telehealth: Payer: Self-pay | Admitting: Oncology

## 2018-12-25 ENCOUNTER — Other Ambulatory Visit: Payer: Federal, State, Local not specified - PPO

## 2018-12-25 ENCOUNTER — Inpatient Hospital Stay: Payer: Federal, State, Local not specified - PPO | Attending: Nurse Practitioner | Admitting: Oncology

## 2018-12-25 VITALS — BP 126/85 | HR 63 | Temp 98.5°F | Resp 17 | Wt 200.0 lb

## 2018-12-25 DIAGNOSIS — R809 Proteinuria, unspecified: Secondary | ICD-10-CM | POA: Insufficient documentation

## 2018-12-25 DIAGNOSIS — I10 Essential (primary) hypertension: Secondary | ICD-10-CM | POA: Insufficient documentation

## 2018-12-25 DIAGNOSIS — Z79899 Other long term (current) drug therapy: Secondary | ICD-10-CM | POA: Diagnosis not present

## 2018-12-25 DIAGNOSIS — F17210 Nicotine dependence, cigarettes, uncomplicated: Secondary | ICD-10-CM | POA: Insufficient documentation

## 2018-12-25 DIAGNOSIS — C73 Malignant neoplasm of thyroid gland: Secondary | ICD-10-CM | POA: Insufficient documentation

## 2018-12-25 NOTE — Progress Notes (Signed)
  [place] OFFICE PROGRESS NOTE   Diagnosis: Thyroid cancer  INTERVAL HISTORY:    [place] Snow returns as scheduled.  He continues lenvatinib.  No rash, bleeding, or diarrhea.  He continues to have discomfort at the left upper leg with weightbearing.  He is using a crutch.  Objective:  Vital signs in last 24 hours:  Blood pressure 126/85, pulse 63, SpO2 98 %.    HEENT: No thrush or ulcers Resp: Lungs clear bilaterally Cardio: Regular rate and rhythm GI: No hepatosplenomegaly, nontender Vascular: No leg edema  Skin: No rash Musculoskeletal: No pain with motion at the left hip    Lab Results: 12/14/2018: TSH 0.013, potassium 3.8, creatinine 0.81, bilirubin 0.5, AST 16, ALT 23, alkaline phosphatase 79, urine protein 14 (0-12)  10/16/2018: Thyroglobulin 1835 05/2018: Thyroglobulin-362 Medications: I have reviewed the patient's current medications.   Assessment/Plan: 1. 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   Multiple destructive bone lesions including a large lesion at L3 with a soft tissue component encroaching on the thecal sac.   Status post total thyroidectomy 11/02/2012 with pathology confirming a papillary thyroid carcinoma, follicular variant.   Markedly elevated thyroglobulin level.   Followed by Dr. Buddy Duty, treated with radioactive iodine.  08/08/2017 iodine-131 scan-multiple sites of abnormal radioiodine accumulation compatible with osseous metastasis. Single new focus of abnormal tracer localization identified in a lateral mid left rib. Remaining sites of abnormal uptake including proximal left femur were identified on the previous exam.  01/05/2018 thyroglobulin 1523 (1186 on 06/30/2017)  04/07/2018 thyroglobulin level greater than 500  Initiation of Lenvatinib 04/17/2018 2. Pain secondary to the L3 mass with compression of the left L3 nerve root. The pain is also also related to the  left iliac mass.He is status post palliative radiation to the lumbar spine and left iliac bone, completed 11/09/2012. 3. Tobacco use.We discussed smoking cessation. He is not ready to quit smoking. 4. Pain at the left upper leg-x-ray 03/24/2018 revealed a large lytic lesion in the proximal left femoral diaphysis.Referral to orthopedic surgery recommended. Patient declined. 5. Proteinuria 05/22/2018- potentially related tolenvatinib  6. hypertension- progressive while on thelenvatinib,the atenololwas increasedto twice daily, losartan added 06/12/2018, improved 7. Hyperpigmented mole at the right upper back noted 10/16/2018     Disposition: [place] appears unchanged.  He will continue lenvatinib.  We will follow-up on the thyroglobulin level obtained in [place] last week.  He declines an orthopedic referral and a restaging I-131 scan.  He will return for an office and lab visit in 3 months.  He would like to have the labs obtained via [place], MD  12/25/2018  8:28 AM

## 2018-12-25 NOTE — Telephone Encounter (Signed)
Scheduled appt per 02/10 los.  Patient middle name is wrong, changed the patient middle name per his request.  Printed calendar and avs.

## 2019-01-01 ENCOUNTER — Other Ambulatory Visit: Payer: Self-pay | Admitting: Oncology

## 2019-01-04 MED FILL — ATENOLOL 50 MG TABLET: 50 | 30 days supply | Qty: 30 | Fill #0

## 2019-01-04 MED FILL — LEVOTHYROXINE 175 MCG TAB: 175 | 90 days supply | Qty: 90 | Fill #4

## 2019-01-04 MED FILL — LENVIMA 14 MG DAILY DOSE: 10 & 4 | 30 days supply | Qty: 60 | Fill #0

## 2019-01-04 MED FILL — LOSARTAN POTASSIUM 50 MG TA: 50 | 30 days supply | Qty: 30 | Fill #0

## 2019-01-26 ENCOUNTER — Other Ambulatory Visit: Payer: Self-pay | Admitting: *Deleted

## 2019-01-26 ENCOUNTER — Other Ambulatory Visit: Payer: Self-pay | Admitting: Oncology

## 2019-01-26 MED ORDER — ATENOLOL 50 MG PO TABS: 50.0000 mg | ORAL_TABLET | Freq: Every day | ORAL | 0 refills | Status: DC

## 2019-01-26 NOTE — Progress Notes (Signed)
Pharmacy request for 90 day supply of atenolol 50mg  . Was approved on 2/20/2, but they are not able to see this in their system. Provided verbal script for 90 day supply.

## 2019-01-29 MED FILL — LENVIMA 14 MG DAILY DOSE: 10 & 4 | 30 days supply | Qty: 60 | Fill #1

## 2019-01-29 MED FILL — LOSARTAN POTASSIUM 50 MG TA: 50 | 30 days supply | Qty: 30 | Fill #0

## 2019-01-29 MED FILL — ATENOLOL 50 MG TABLET: 50 | 90 days supply | Qty: 90 | Fill #0

## 2019-02-17 ENCOUNTER — Other Ambulatory Visit: Payer: Self-pay | Admitting: Oncology

## 2019-02-26 ENCOUNTER — Telehealth: Payer: Self-pay

## 2019-02-26 ENCOUNTER — Other Ambulatory Visit: Payer: Self-pay | Admitting: Pharmacist

## 2019-02-26 NOTE — Telephone Encounter (Signed)
Oral Oncology Patient Advocate Encounter  Received notification from [facility] that the existing prior authorization for [name] is due for renewal.  Submitted by fax [phone] Status is pending  [facility] will continue to follow.  [facility] Patient Protection Phone [phone] Fax [phone] 02/26/2019    11:56 AM

## 2019-02-28 MED FILL — LENVIMA 14 MG DAILY DOSE: 10 & 4 | 30 days supply | Qty: 60 | Fill #0

## 2019-02-28 MED FILL — LOSARTAN POTASSIUM 50 MG TA: 50 | 30 days supply | Qty: 30 | Fill #1

## 2019-02-28 NOTE — Telephone Encounter (Signed)
Oral Oncology Patient Advocate Encounter  Prior Authorization for [place] has been approved.    Effective dates: 01/27/19 through 02/26/20  [facility] Clinic will continue to follow.   [place] Patient Ward Phone [phone] Fax [phone] 02/28/2019    9:07 AM

## 2019-03-09 ENCOUNTER — Telehealth: Payer: Self-pay | Admitting: Oncology

## 2019-03-09 NOTE — Telephone Encounter (Signed)
Returning patient's phone call regarding rescheduling appointments, left a voicemail.

## 2019-03-20 ENCOUNTER — Ambulatory Visit: Payer: Federal, State, Local not specified - PPO | Admitting: Oncology

## 2019-03-29 MED FILL — LENVIMA 14 MG DAILY DOSE: 10 & 4 | 30 days supply | Qty: 60 | Fill #1

## 2019-03-29 MED FILL — LEVOTHYROXINE 175 MCG TAB: 175 | 30 days supply | Qty: 30 | Fill #5

## 2019-03-29 MED FILL — LOSARTAN POTASSIUM 50 MG TA: 50 | 30 days supply | Qty: 30 | Fill #2

## 2019-04-02 ENCOUNTER — Other Ambulatory Visit: Payer: Self-pay | Admitting: Oncology

## 2019-04-20 ENCOUNTER — Other Ambulatory Visit: Payer: Self-pay | Admitting: Oncology

## 2019-04-26 ENCOUNTER — Telehealth: Payer: Self-pay | Admitting: Pharmacy Technician

## 2019-04-26 MED FILL — LOSARTAN POTASSIUM 50 MG TA: 50 | 90 days supply | Qty: 90 | Fill #0

## 2019-04-26 MED FILL — LENVIMA 14 MG DAILY DOSE: 10 & 4 | 30 days supply | Qty: 60 | Fill #0

## 2019-04-26 MED FILL — LEVOTHYROXINE 175 MCG TAB: 175 | 90 days supply | Qty: 90 | Fill #0

## 2019-04-26 MED FILL — ATENOLOL 50 MG TABLET: 50 | 30 days supply | Qty: 30 | Fill #1

## 2019-04-26 NOTE — Telephone Encounter (Signed)
Oral Oncology Patient Advocate Encounter:  Received email from [facility] that patient's grant has termed. We did not have documentation that patient had a grant, so it appears he self-enrolled. There are currently no grants open to enroll patient at this time.    We enrolled patient into a [place]. With this card his copay is zero. Provided info to pharmacy, shipment will go out today as scheduled. Spoke to patient, and advised of above.  11:12 AM Beatriz Chancellor, CPhT

## 2019-05-17 ENCOUNTER — Telehealth: Payer: Self-pay | Admitting: *Deleted

## 2019-05-17 NOTE — Telephone Encounter (Signed)
Notified patient that the labs he had done on 04/26/19 were not what he needed (did CMP and Thyroid stimulating immunoglobulin). Dr. Benay Spice needs CBC/diff and Thyroglobulin level. Requested he call back with contact information for lab to fax orders for collection.

## 2019-05-22 ENCOUNTER — Inpatient Hospital Stay: Payer: Federal, State, Local not specified - PPO | Admitting: Oncology

## 2019-05-23 ENCOUNTER — Other Ambulatory Visit: Payer: Self-pay | Admitting: Oncology

## 2019-05-24 ENCOUNTER — Telehealth: Payer: Self-pay | Admitting: *Deleted

## 2019-05-24 MED FILL — ATENOLOL 50 MG TABLET: 50 | 90 days supply | Qty: 90 | Fill #0

## 2019-05-24 NOTE — Telephone Encounter (Addendum)
Informed him that the lab in [place] did not run the correct tests: MD needs CBC/diff and an thyroglobulin level. He will call back tomorrow with address or fax # of lab to send new script. Called patient back on 06/01/19 to inquire how to get script for labs to him. He requested it be mailed to: [address] [place], [place] [address]. Script mailed and included an old lab report with the correct lab thyroglobulin level for him to show the lab when he is there

## 2019-05-30 MED FILL — LENVIMA 14 MG DAILY DOSE: 10 & 4 | 30 days supply | Qty: 60 | Fill #1

## 2019-06-25 ENCOUNTER — Other Ambulatory Visit: Payer: Self-pay | Admitting: Oncology

## 2019-06-26 ENCOUNTER — Telehealth: Payer: Self-pay | Admitting: *Deleted

## 2019-06-26 NOTE — Telephone Encounter (Signed)
Called patient to inquire if he has gone to [place] lab yet to have the CBC/diff and thyroglobulin level drawn. He plans to go later this week or early next week. MD gave approval to refill his Lenvatinib and losartan.

## 2019-07-04 MED FILL — LENVIMA 14 MG DAILY DOSE: 10 & 4 | 30 days supply | Qty: 60 | Fill #0

## 2019-07-04 MED FILL — LOSARTAN POTASSIUM 50 MG TA: 50 | 30 days supply | Qty: 30 | Fill #0

## 2019-07-17 ENCOUNTER — Ambulatory Visit: Payer: Federal, State, Local not specified - PPO | Admitting: Oncology

## 2019-07-17 ENCOUNTER — Telehealth: Payer: Self-pay | Admitting: *Deleted

## 2019-07-17 NOTE — Telephone Encounter (Signed)
Call to [facility] requesting results on thyroglobulin level drawn on 07/03/19. Was in formed that blood was sent to [facility] to process and is still pending. Lab tech called [facility] to obtain estimated date of result report.

## 2019-07-18 ENCOUNTER — Telehealth: Payer: Self-pay | Admitting: *Deleted

## 2019-07-18 NOTE — Telephone Encounter (Signed)
Notified of thyroglobulin result of 716.4 from [facility] with from his collection on 07/03/2019. Per Dr. Benay Spice: Continue Lenvatinib and follow up as scheduled.

## 2019-08-01 MED FILL — LENVIMA 14 MG DAILY DOSE: 10 & 4 | 30 days supply | Qty: 60 | Fill #1

## 2019-08-01 MED FILL — LOSARTAN POTASSIUM 50 MG TA: 50 | 30 days supply | Qty: 30 | Fill #1

## 2019-08-01 MED FILL — LEVOTHYROXINE 175 MCG TAB: 175 | 90 days supply | Qty: 90 | Fill #1

## 2019-08-01 MED FILL — ATENOLOL 50 MG TABLET: 50 | 30 days supply | Qty: 30 | Fill #2

## 2019-08-28 ENCOUNTER — Other Ambulatory Visit: Payer: Self-pay | Admitting: Oncology

## 2019-08-30 MED FILL — ATENOLOL 50 MG TABLET: 50 | 30 days supply | Qty: 30 | Fill #0

## 2019-08-30 MED FILL — LENVIMA 14 MG DAILY DOSE: 10 & 4 | 30 days supply | Qty: 60 | Fill #0

## 2019-08-30 MED FILL — LOSARTAN POTASSIUM 50 MG TA: 50 | 30 days supply | Qty: 30 | Fill #2

## 2019-09-13 ENCOUNTER — Telehealth: Payer: Self-pay | Admitting: *Deleted

## 2019-09-13 NOTE — Telephone Encounter (Addendum)
Called patient to inform him that script for labs will be mailed to his home today. MD has ordered CBC/diff, CMP and thyroglobulin level. Also sent copy of last thyroglobulin level drawn in [place] to assist them in entering the correct order in their system. Confirmed his [place] address. Patient asking about his BP readings. Checks BP every morning and has been running 128-136/90 consistently, with a one time reading of 152/107. Taking losartan 50 mg qd and atenolol 50 mg daily. Does he need any dose adjustment? Per Dr. Benay Spice: Do not increase BP meds at this time. Be sure to watch sodium intake.

## 2019-09-17 ENCOUNTER — Inpatient Hospital Stay: Payer: Federal, State, Local not specified - PPO | Admitting: Oncology

## 2019-09-24 ENCOUNTER — Other Ambulatory Visit: Payer: Self-pay | Admitting: Oncology

## 2019-10-01 MED FILL — ATENOLOL 50 MG TABLET: 50 | 30 days supply | Qty: 30 | Fill #0

## 2019-10-01 MED FILL — LOSARTAN POTASSIUM 50 MG TA: 50 | 30 days supply | Qty: 30 | Fill #0

## 2019-10-01 MED FILL — LENVIMA 14 MG DAILY DOSE: 10 & 4 | 30 days supply | Qty: 60 | Fill #0

## 2019-10-23 ENCOUNTER — Other Ambulatory Visit: Payer: Self-pay | Admitting: Oncology

## 2019-10-26 MED FILL — LEVOTHYROXINE 175 MCG TAB: 175 | 90 days supply | Qty: 90 | Fill #0

## 2019-10-31 ENCOUNTER — Telehealth: Payer: Self-pay | Admitting: *Deleted

## 2019-10-31 NOTE — Telephone Encounter (Signed)
Left VM reporting he is having indigestion and diarrhea/loose stools over past week. What can he take and should he hold his lenvatinib a few days? RN called back and left VM asking if he has fever, N/V, what has he taken for indigestion, how many stools/day is he having and what is he taking for this? Is he still on reglan?

## 2019-11-01 ENCOUNTER — Telehealth: Payer: Self-pay | Admitting: *Deleted

## 2019-11-01 MED FILL — LOSARTAN POTASSIUM 50 MG TA: 50 | 30 days supply | Qty: 30 | Fill #1

## 2019-11-01 MED FILL — LENVIMA 14 MG DAILY DOSE: 10 & 4 | 30 days supply | Qty: 60 | Fill #0

## 2019-11-01 MED FILL — ATENOLOL 50 MG TABLET: 50 | 30 days supply | Qty: 30 | Fill #1

## 2019-11-01 NOTE — Telephone Encounter (Signed)
Telephone call received from patient- He had numerous concerns to discuss this morning.  Diarrhea- he is having only 1 stool a day but it is loose and "floats". He continues to have stomach grumbling and a little cramping. This morning he had a normal bowel movement. He is not taking Reglan and will continue to hold this medication since he is encouraged with his stool this morning. He has not taken any medication and states he is suspicious this is a combination of Lenvima and his diet. Discussed healthy choices for food and monitoring his bowel movements to see what food aggravate his GI.  January appt- He states he wants to reschedule for March. Encouraged patient to keep appt since it has been a while since his last visit. He reports he is still living in [place] and it is difficult for him to travel. He is fearful of COVID and would like to stay in at this time. Writer suggested a video visit. Patient states he is not able to use a computer very well. Again offered a telephone visit and he agrees to be available at his original appt time to speak with Dr. Benay Spice.  COVID Vaccine- Patient states he is very anxious be get his CV19 vaccine. He explains he is at high risk, he is a smoker, has cancer and a multitude of other health complications. Discussed smoking cessation, patient is not ready to quit. Writer explained this office does not have a time line as to when this would be available to patients here. We would keep him posted and he should pay attention to the news in the area around him as he may be able to get it quicker.  Drug Holiday- He would like to stop the [facility] for a brief period to see how he feels without the drug in his system. He feels fatigued and overall just not well all the time. He reports dissatisfaction with his hypertension and the GI issues he experiences. Discussed this with Dr. Benay Spice and he agrees with proceeding with a drug holiday. Patient states he wants to wait  until January to start this since he will have a small lapse in insurance coverage next year. He will start the holiday mid to late January.   Blood Work- Patient will go to a local lab to have his blood work completed in the next few days. He is requesting results be sent to his endocrinologist for review also. He has a telephone appt with Dr Buddy Duty on December 31st and wants to make sure he has these results for that appt.

## 2019-11-15 ENCOUNTER — Telehealth: Payer: Self-pay | Admitting: *Deleted

## 2019-11-15 NOTE — Telephone Encounter (Signed)
Notified patient that with elevation in his liver functions, MD wants him to hold his lenvatinib and recheck CMP in 2 weeks. Will mail script to him to have this done. Asking what to do about his losartan? Dr. Benay Spice said to continue the losartan for now. Instructed patient that he can hold it and call if he develops hypotension. He understands and agrees. Confirmed he has telephone visit with Dr. Benay Spice on 11/20/19 at 11:30 and can discuss this in detail at that time.

## 2019-11-20 ENCOUNTER — Inpatient Hospital Stay: Payer: Federal, State, Local not specified - PPO | Attending: Oncology | Admitting: Oncology

## 2019-11-20 ENCOUNTER — Other Ambulatory Visit: Payer: Self-pay | Admitting: Oncology

## 2019-11-20 DIAGNOSIS — C73 Malignant neoplasm of thyroid gland: Secondary | ICD-10-CM

## 2019-11-20 MED ORDER — HYDROCODONE-ACETAMINOPHEN 10-325 MG PO TABS: 1.0000 | ORAL_TABLET | Freq: Four times a day (QID) | ORAL | 0 refills | Status: DC | PRN

## 2019-11-20 MED ORDER — ALPRAZOLAM 0.5 MG PO TABS: ORAL_TABLET | ORAL | 0 refills | Status: DC

## 2019-11-20 MED FILL — HYDROCODON-APAP 10-325: 10-325 | 15 days supply | Qty: 60 | Fill #0

## 2019-11-20 NOTE — Progress Notes (Signed)
[name] OFFICE VISIT PROGRESS NOTE  I connected with [place] on 11/20/19 at 1 PM EST by  and verified that I am speaking with the correct person using two identifiers.   I discussed the limitations, risks, security and privacy concerns of performing an evaluation and management service by telemedicine and the availability of in-person appointments. I also discussed with the patient that there may be a patient responsible charge related to this service. The patient expressed understanding and agreed to proceed.    Patient's location: Home Provider's location: Office   Diagnosis: Thyroid cancer  INTERVAL HISTORY:   [place] is seen today for a telehealth visit.  This is secondary to the Covid pandemic.  He continues to live in [place] and reports he is unable to travel to [place] at present.  He continues Lenvima until 11/15/2018 when the lymphedema was discontinued secondary to elevated liver enzymes.  He reports increased "indigestion" over the past month with associated anorexia and a 10 pound weight loss.  The symptoms have improved since discontinuing Lenvima.  He reports similar symptoms in the past.  He has gas and "wet "bowel movements, but no frank diarrhea.  The left leg pain is improved.  He now has pain near the left hip.  The pain is relieved with ibuprofen.  He has developed a rash at the right anterior chest extending to the right upper back.  The rash has been present over the past week and is pruritic and painful.  The blood pressure was as low as 90/50 after starting Lenvima.  He has discontinued losartan in the blood pressure is now higher.   Lab Results:  See electronic medical record Medications: I have reviewed the patient's current medications.  Assessment/Plan: 1. 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   Multiple destructive bone lesions  including a large lesion at L3 with a soft tissue component encroaching on the thecal sac.   Status post total thyroidectomy 11/02/2012 with pathology confirming a papillary thyroid carcinoma, follicular variant.   Markedly elevated thyroglobulin level.   Followed by Dr. Buddy Duty, treated with radioactive iodine.  08/08/2017 iodine-131 scan-multiple sites of abnormal radioiodine accumulation compatible with osseous metastasis. Single new focus of abnormal tracer localization identified in a lateral mid left rib. Remaining sites of abnormal uptake including proximal left femur were identified on the previous exam.  01/05/2018 thyroglobulin 1523 (1186 on 06/30/2017)  04/07/2018 thyroglobulin level greater than 500  Initiation of Lenvatinib 04/17/2018  Thyroglobulin level stable 11/02/2019  Lenvima placed on hold 11/15/2018 secondary to elevated liver enzymes and proteinuria 2. Pain secondary to the L3 mass with compression of the left L3 nerve root. The pain is also also related to the left iliac mass.He is status post palliative radiation to the lumbar spine and left iliac bone, completed 11/09/2012. 3. Tobacco use.We discussed smoking cessation. He is not ready to quit smoking. 4. Pain at the left upper leg-x-ray 03/24/2018 revealed a large lytic lesion in the proximal left femoral diaphysis.Referral to orthopedic surgery recommended. Patient declined. 5. Proteinuria 05/22/2018- potentially related tolenvatinib  6. hypertension- progressive while on thelenvatinib,the atenololwas increasedto twice daily, losartan added 06/12/2018, improved 7. Hyperpigmented mole at the right upper back noted 10/16/2018     Disposition: [place] has metastatic papillary thyroid cancer.  Is been treated with Lenvima since June 2019.  There is no clinical evidence of disease progression.  Lenvima was placed on hold last week secondary  to proteinuria and elevated liver enzymes.  [name] will remain  on hold until he has repeat labs.  I recommended he be evaluated for the rash in [name].  He may have a zoster rash.  It is possible the rash is related to lymphedema or another etiology.  He declines a medical evaluation at present.  [place] will be scheduled for repeat labs within the next 2 weeks.  He will be scheduled for a telephone visit when the lab results are available.  He requested a refill on Xanax and hydrocodone.  These were refilled today.   I discussed the assessment and treatment plan with the patient. The patient was provided an opportunity to ask questions and all were answered. The patient agreed with the plan and demonstrated an understanding of the instructions.   The patient was advised to call back or seek an in-person evaluation if the symptoms worsen or if the condition fails to improve as anticipated.  I provided 30 minutes of telephone, chart review, and documentation time during this encounter, and > 50% was spent counseling as documented under my assessment & plan.  Betsy Coder ANP/GNP-BC   11/20/2019 1:48 PM

## 2019-11-22 ENCOUNTER — Telehealth: Payer: Self-pay | Admitting: Oncology

## 2019-11-22 NOTE — Telephone Encounter (Signed)
I left a message regarding phone visit

## 2019-11-26 ENCOUNTER — Other Ambulatory Visit: Payer: Self-pay | Admitting: Oncology

## 2019-11-26 ENCOUNTER — Telehealth: Payer: Self-pay | Admitting: *Deleted

## 2019-11-26 MED ORDER — ALPRAZOLAM 0.5 MG PO TABS: ORAL_TABLET | ORAL | 0 refills | Status: DC

## 2019-11-26 MED FILL — ALPRAZolam 0.5 MG TABS: 0.5 | 15 days supply | Qty: 30 | Fill #0

## 2019-11-26 NOTE — Telephone Encounter (Signed)
Asking if MD will approve refill on his lenvatinib? He will not take it until after his repeat labs and MD review with new directions. Also requesting refill on his xanax. He did receive the faxed lab orders and will go sometime next week. Per Dr. Benay Spice: OK to refill both.

## 2019-11-29 MED FILL — ATENOLOL 50 MG TABLET: 50 | 30 days supply | Qty: 30 | Fill #2

## 2019-11-29 MED FILL — LENVIMA 14 MG DAILY DOSE: 10 & 4 | 30 days supply | Qty: 60 | Fill #0

## 2019-11-29 MED FILL — LOSARTAN POTASSIUM 50 MG TA: 50 | 30 days supply | Qty: 30 | Fill #2

## 2019-11-30 ENCOUNTER — Telehealth: Payer: Self-pay | Admitting: Oncology

## 2019-12-03 ENCOUNTER — Inpatient Hospital Stay: Payer: Federal, State, Local not specified - PPO | Admitting: Oncology

## 2019-12-05 ENCOUNTER — Telehealth: Payer: Self-pay | Admitting: *Deleted

## 2019-12-05 DIAGNOSIS — C73 Malignant neoplasm of thyroid gland: Secondary | ICD-10-CM

## 2019-12-05 NOTE — Telephone Encounter (Signed)
Labs received from [facility]: showing significant increase in liver functions with total bili 4.5. Per Dr. Benay Spice: Needs to be seen asap by GI. Continue to hold Lenvima. Patient reports he does not have a GI physician in [place] or [place]. His girl friend, [place] sees Dr. Orlene Erm at [facility]. Informed patient that records will be faxed with referral request today. Suggested he call tomorrow if he has not heard from them about getting in to be seen. Patient refuses to come to [facility] to see Dr. Benay Spice until COVID situation is better. Notified to continue to hold the Lenvima. He denies that he drinks alcohol. Faxed chart information and demographics to [phone] (Phone [phone]) that was provided by office.

## 2019-12-06 NOTE — Telephone Encounter (Signed)
Called to report that he sees GI physician on 12/10/19.

## 2019-12-10 LAB — PROTIME-INR: INR: 1 (ref 0.9–1.1)

## 2019-12-17 ENCOUNTER — Telehealth: Payer: Self-pay | Admitting: *Deleted

## 2019-12-17 NOTE — Telephone Encounter (Signed)
Received CBC results from 12/10/19 from [facility] and Hgb is 10.3. Dr. Benay Spice notes this is a new finding of anemia and needs to be evaluated. He needs to see Dr. Benay Spice or obtain a hem/onc MD in [place]. Patient reports he returns to GI tomorrow at 0830 for f/u appointment. Will call him tomorrow afternoon to f/u on plans.

## 2019-12-17 NOTE — Telephone Encounter (Signed)
Called back and left VM requesting this office provide him with the lab results from [place] practice. Also asking if he should start taking ferrous sulfate? Corrected his appointment information--he returns on 12/19/19.

## 2019-12-20 ENCOUNTER — Telehealth: Payer: Self-pay | Admitting: *Deleted

## 2019-12-20 NOTE — Telephone Encounter (Signed)
Instructed patient to continue to hold Lenvatinib. Follow up with GI. Dr Benay Spice would like to see him in 1-2 months. Patient states he will come to [facility] after he gets the vaccine.  Message to scheduler.

## 2019-12-24 ENCOUNTER — Telehealth: Payer: Self-pay | Admitting: Oncology

## 2019-12-24 NOTE — Telephone Encounter (Signed)
No answer - scheduled appt per 2/4 sch message - mailed letter with appt date and time

## 2019-12-25 LAB — PROTIME-INR: INR: 1 (ref 0.9–1.1)

## 2019-12-28 ENCOUNTER — Telehealth: Payer: Self-pay | Admitting: *Deleted

## 2019-12-28 NOTE — Telephone Encounter (Addendum)
Called patient to inform him that GI in [place] has been sending office notes and labs and Dr. Benay Spice has reviewed them. Does not want to resume the lenvatinib. Patient does not want to come to [facility] until he has had both COVID vaccines (had 1st one on 12/18/19). GI plans on recheck of his labs in 6 weeks. He is asking when should his thyroglobulin level be checked again? Would like to have it checked and then do a WebEx visit with Dr. Benay Spice if he agrees to this. Has concerns about being off treatment.  He did say there is an oncologist in [place] that he may reach out to him in the future, but "not right now". Per Dr. Benay Spice: Recheck thyroglobulin level at next lab draw (script mailed to his home). Needs video visit on 2/19 or 2/18 at 0800. Patient has chosen 2/19 at 0800. Scheduling message sent to coordinate this.

## 2020-01-03 ENCOUNTER — Telehealth: Payer: Self-pay | Admitting: Oncology

## 2020-01-03 NOTE — Telephone Encounter (Signed)
Called and left msg about 2/19 appt being a mychart video visit

## 2020-01-04 ENCOUNTER — Encounter: Payer: Self-pay | Admitting: *Deleted

## 2020-01-04 ENCOUNTER — Inpatient Hospital Stay: Payer: Federal, State, Local not specified - PPO | Attending: Oncology | Admitting: Oncology

## 2020-01-04 DIAGNOSIS — C73 Malignant neoplasm of thyroid gland: Secondary | ICD-10-CM

## 2020-01-04 NOTE — Progress Notes (Signed)
Mailed script to home for CMP, CBC/diff, TSH, B12, T4 to have drawn in [place] and faxed to Dr. Benay Spice.

## 2020-01-04 NOTE — Progress Notes (Signed)
[place] OFFICE VISIT PROGRESS NOTE  I connected with Gregory Snow on 01/04/20 at  8:00 AM EST by telephone and verified that I am speaking with the correct person using two identifiers.   I discussed the limitations, risks, security and privacy concerns of performing an evaluation and management service by telemedicine and the availability of in-person appointments. I also discussed with the patient that there may be a patient responsible charge related to this service. The patient expressed understanding and agreed to proceed.    Patient's location: Home Provider's location: Office   Diagnosis: Thyroid cancer  INTERVAL HISTORY:   Gregory Snow is seen today for a telehealth visit.  This is secondary to distancing with the Covid pandemic and his current out-of-town location.  The visit started as a video visit, but due to technical difficulty the visit was converted to a telephone visit.  Gregory Snow  He has been maintained off of lenvima since 11/16/2019 with a liver enzymes were noted to be elevated.  He reports feeling better off of Lenvima.  He noted indigestion, dark urine, light stool, and anorexia with an 8 pound weight loss prior to discontinuing Lenvima.  He now has a good appetite and has gained approximately 4 pounds.  He has seen gastroenterology for evaluation of the elevated liver enzymes.  An extensive diagnostic work-up for liver disease has been negative to date.  Repeat liver enzymes on 12/25/2019 revealed improvement with the total bilirubin at 1.3, alkaline phosphatase 221, AST 15, and ALT 13.  He was also noted to be anemic on labs 12/10/2019 with a hemoglobin of 10.3, MCV 102.9.  Hemoglobin returned at 11.3 with MCV of 102.3 on 12/25/2019. He denies bleeding.  He has persistent discomfort at the left femur.  He is currently not taking pain medication.  He has "muscle aches "and other sites and shoulder pain.  He reports  he is no longer taking losartan.  His blood pressure is measured at 120/80.  He has received the first COVID-19 vaccine and is scheduled for the second vaccine on 01/17/2020.  He would like to wait until after the second vaccine to obtain additional laboratory studies.    Objective:  Vital signs in last 24 hours:   Lab Results:  Lab Results  Component Value Date   WBC 9.0 10/16/2018   HGB 14.7 10/16/2018   HCT 43.6 10/16/2018   MCV 99.5 10/16/2018   PLT 188 10/16/2018   NEUTROABS 7.2 10/16/2018    Medications: I have reviewed the patient's current medications.  Assessment/Plan: 1. 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   Multiple destructive bone lesions including a large lesion at L3 with a soft tissue component encroaching on the thecal sac.   Status post total thyroidectomy 11/02/2012 with pathology confirming a papillary thyroid carcinoma, follicular variant.   Markedly elevated thyroglobulin level.   Followed by Dr. Buddy Duty, treated with radioactive iodine.  08/08/2017 iodine-131 scan-multiple sites of abnormal radioiodine accumulation compatible with osseous metastasis. Single new focus of abnormal tracer localization identified in a lateral mid left rib. Remaining sites of abnormal uptake including proximal left femur were identified on the previous exam.  01/05/2018 thyroglobulin 1523 (1186 on 06/30/2017)  04/07/2018 thyroglobulin level greater than 500  Initiation of Lenvatinib 04/17/2018  Thyroglobulin level stable 11/02/2019  Lenvima placed on hold 11/15/2018 secondary to elevated liver enzymes and proteinuria 2. Pain secondary to the L3 mass with compression of the left L3  nerve root. The pain is also also related to the left iliac mass.He is status post palliative radiation to the lumbar spine and left iliac bone, completed 11/09/2012. 3. Tobacco use.We discussed smoking cessation. He is not ready to  quit smoking. 4. Pain at the left upper leg-x-ray 03/24/2018 revealed a large lytic lesion in the proximal left femoral diaphysis.Referral to orthopedic surgery recommended. Patient declined. 5. Proteinuria 05/22/2018- potentially related tolenvatinib  6. hypertension- progressive while on thelenvatinib,the atenololwas increasedto twice daily, losartan added 06/12/2018, improved 7. Hyperpigmented mole at the right upper back noted 10/16/2018 8. Elevated liver enzymes January 2021-potentially related to [facility], referred to gastroenterology, improved 9. Anemia January 2021-macrocytic, improved on 12/25/2019      Disposition: GregorySnow has metastatic papillary thyroid cancer involving the bones.  He is currently maintained off of systemic therapy after the liver enzymes returned markedly elevated last month.  The transaminases have normalized and the bilirubin was mildly elevated on 12/25/2019.  The plan is to continue holding [place].  He would like to wait on repeat labs until after he receives the second COVID-19 vaccine.  He was also anemic last month.  The anemia could also be related to lymphedema.  He reports no bleeding.  The anemia is also secondary to chronic disease and bone marrow involvement tumor.  Mr. Ferrelli will have laboratory studies obtained during the week of 01/20/2018 in [place].  These results will be forwarded to [place].  This will include a CBC, chemistry panel, weight function studies, thyroglobulin level, and vitamin B12 level.  He will be scheduled for a video visit during the week of 01/28/2020.  He plans to stay in [place] for now.  He will establish with an endocrinologist there and would like to continue oncology follow-up here.  We will discuss resuming Lenvima versus changing to a different systemic therapy when he is seen in March.   I discussed the assessment and treatment plan with the patient. The patient was provided an opportunity to ask questions and  all were answered. The patient agreed with the plan and demonstrated an understanding of the instructions.   The patient was advised to call back or seek an in-person evaluation if the symptoms worsen or if the condition fails to improve as anticipated.  I provided 35 minutes of telephone, chart review, and documentation during this encounter, and > 50% was spent counseling as documented under my assessment & plan.  Betsy Coder ANP/GNP-BC   01/04/2020 8:06 AM

## 2020-01-07 ENCOUNTER — Telehealth: Payer: Self-pay | Admitting: Oncology

## 2020-01-07 NOTE — Telephone Encounter (Signed)
Scheduled per los. Called, not able to leave msg. Mailed pirntout  

## 2020-01-11 ENCOUNTER — Other Ambulatory Visit: Payer: Self-pay | Admitting: Oncology

## 2020-01-11 MED FILL — ATENOLOL 50 MG TABLET: 50 | 30 days supply | Qty: 30 | Fill #0

## 2020-01-14 ENCOUNTER — Telehealth: Payer: Self-pay

## 2020-01-14 NOTE — Telephone Encounter (Signed)
Oral Oncology Patient Advocate Encounter  Prior Authorization for [name] has been approved.    PA# [id number] Effective dates: 12/15/19 through 01/13/21   Oral Oncology Clinic will continue to follow.   [place] [facility] [place] Phone [phone] Fax [phone] 01/14/2020 3:35 PM

## 2020-01-24 MED FILL — LEVOTHYROXINE 175 MCG TAB: 175 | 90 days supply | Qty: 90 | Fill #0

## 2020-01-28 ENCOUNTER — Inpatient Hospital Stay: Payer: Federal, State, Local not specified - PPO | Attending: Oncology | Admitting: Oncology

## 2020-01-28 DIAGNOSIS — C73 Malignant neoplasm of thyroid gland: Secondary | ICD-10-CM

## 2020-01-28 NOTE — Progress Notes (Signed)
[facility] OFFICE VISIT PROGRESS NOTE  I connected with Gregory Snow on 01/28/20 at  9:30 AM EDT by video and verified that I am speaking with the correct person using two identifiers.   I discussed the limitations, risks, security and privacy concerns of performing an evaluation and management service by telemedicine and the availability of in-person appointments. I also discussed with the patient that there may be a patient responsible charge related to this service. The patient expressed understanding and agreed to proceed.   Patient's location: Home Provider's location: Office   Diagnosis: Thyroid cancer  INTERVAL HISTORY:   Gregory Snow is seen today for a telehealth visit.  This is secondary to distancing with the Covid pandemic.  He remains off of Lenvima.  He reports improvement in his appetite.  He has gained weight that he lost over the past few months.  He reports increased generalized "aching "since discontinuing Lenvima.  He has increased discomfort at the lower back and left leg.  He takes ibuprofen daily.  He is not using hydrocodone. His blood pressure has blood pressure has been normal with systolic readings between 120 and [id number] and a diastolic of between 80 and 82.  No new complaint.   Lab Results:  01/22/2020 at [facility]: Hemotwelve 0.9, MCV 101.8, platelets 222,000, white count 9.1, ANC 6.1, creatinine 0.78, bilirubin 0.5, AST 18, ALT 16, alkaline phosphatase 113, TSH 0.009, vitamin B12 388 Medications: I have reviewed the patient's current medications.  Assessment/Plan: 1. 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   Multiple destructive bone lesions including a large lesion at L3 with a soft tissue component encroaching on the thecal sac.   Status post total thyroidectomy 11/02/2012 with pathology confirming a papillary thyroid carcinoma,  follicular variant.   Markedly elevated thyroglobulin level.   Followed by Dr. Buddy Duty, treated with radioactive iodine.  08/08/2017 iodine-131 scan-multiple sites of abnormal radioiodine accumulation compatible with osseous metastasis. Single new focus of abnormal tracer localization identified in a lateral mid left rib. Remaining sites of abnormal uptake including proximal left femur were identified on the previous exam.  01/05/2018 thyroglobulin 1523 (1186 on 06/30/2017)  04/07/2018 thyroglobulin level greater than 500  Initiation of Lenvatinib 04/17/2018  Thyroglobulin level stable 11/02/2019  Lenvima placed on hold 11/15/2018 secondary to elevated liver enzymes and proteinuria 2. Pain secondary to the L3 mass with compression of the left L3 nerve root. The pain is also also related to the left iliac mass.He is status post palliative radiation to the lumbar spine and left iliac bone, completed 11/09/2012. 3. Tobacco use.We discussed smoking cessation. He is not ready to quit smoking. 4. Pain at the left upper leg-x-ray 03/24/2018 revealed a large lytic lesion in the proximal left femoral diaphysis.Referral to orthopedic surgery recommended. Patient declined. 5. Proteinuria 05/22/2018- potentially related tolenvatinib  6. hypertension- progressive while on thelenvatinib,the atenololwas increasedto twice daily, losartan added 06/12/2018, improved 7. Hyperpigmented mole at the right upper back noted 10/16/2018 8. Elevated liver enzymes January 2021-potentially related to [facility], referred to gastroenterology, normalized on 01/22/2020 9. Anemia January 2021-macrocytic, improved   Disposition: Gregory Snow has metastatic papillary thyroid cancer.  He has been maintained on lenvatinib prior to developing elevated liver enzymes.  Lenvatinib has been on hold since 11/16/2019.  The liver enzymes have normalized.  He had anorexia and weight loss when the liver enzymes were elevated.  The  etiology of the symptoms and liver enzyme elevation  is unclear, but his symptoms improved when the lenvatinib was discontinued.  It is possible he had a viral illness or biliary stone accounting for his symptoms and elevated liver enzymes.  I will consult with the Cancer center pharmacy regarding resuming lenvatinib.  Lenvatinib remains on hold for now.  We will follow up on the thyroglobulin level from last week.  [name] will be scheduled for a video visit in 4-5 weeks.  We will arrange for follow-up labs in [place] if he resumes lenvatinib.  He has received both doses of the COVID-19 vaccine.  I discussed the assessment and treatment plan with the patient. The patient was provided an opportunity to ask questions and all were answered. The patient agreed with the plan and demonstrated an understanding of the instructions.   The patient was advised to call back or seek an in-person evaluation if the symptoms worsen or if the condition fails to improve as anticipated.  I provided 30 minutes of video, chart review, and documentation time during this encounter, and > 50% was spent counseling as documented under my assessment & plan.  Betsy Coder ANP/GNP-BC   01/28/2020 10:05 AM

## 2020-01-29 ENCOUNTER — Telehealth: Payer: Self-pay

## 2020-01-29 NOTE — Telephone Encounter (Signed)
Called to obtain thyroglobulin level per MD Sherrill. [facility] will call back with results when completed. Contact information for [facility] given

## 2020-01-30 ENCOUNTER — Telehealth: Payer: Self-pay | Admitting: *Deleted

## 2020-01-30 NOTE — Telephone Encounter (Signed)
Called Gregory Snow. New orders for Thyroglobulin Level faxed to [facility] (to be done in 2 weeks). Also faxed orders for CBC with diff and CMET.   Will mail prescriptions to patient. He understands to start Lenvima 10 mg daily- get labs in 2 weeks and 4 weeks

## 2020-01-30 NOTE — Telephone Encounter (Signed)
Dr Benay Spice wants Gregory Snow to resume Lenvima 10 mg daily. Needs to have labs-liver panel drawn in 2 weeks and in 4 weeks. Thyroglobulin level still pending.  Patient notified- verbalized understanding

## 2020-02-05 ENCOUNTER — Telehealth: Payer: Self-pay

## 2020-02-05 NOTE — Telephone Encounter (Signed)
No answer. Call placed to patient as requested.

## 2020-02-06 ENCOUNTER — Telehealth: Payer: Self-pay | Admitting: *Deleted

## 2020-02-06 NOTE — Telephone Encounter (Signed)
Pt called to let Dr Benay Spice know he has restarted losartan due to elevated BP since he restarted Lenvima- Dr Benay Spice is aware.

## 2020-02-11 ENCOUNTER — Other Ambulatory Visit: Payer: Self-pay | Admitting: *Deleted

## 2020-02-11 ENCOUNTER — Other Ambulatory Visit: Payer: Self-pay | Admitting: Oncology

## 2020-02-11 MED ORDER — LENVIMA (10 MG DAILY DOSE) 10 MG PO CPPK: 10.0000 mg | ORAL_CAPSULE | Freq: Every day | ORAL | 1 refills | Status: DC

## 2020-02-13 ENCOUNTER — Encounter: Payer: Self-pay | Admitting: Oncology

## 2020-02-20 ENCOUNTER — Telehealth: Payer: Self-pay | Admitting: *Deleted

## 2020-02-20 NOTE — Telephone Encounter (Signed)
MD reviewed all labs: CBC,CMP and thyroglobulin (2807.2). Just resumed his Lenvatinib at 10 mg/day on 01/30/20. Per Dr. Benay Spice: Repeat CBC/diff, CMP week of 03/10/20 with OV or video visit few days after. Patient notified and agrees to call the day he goes in for lab to set up video visit. Mailed order for labs to his residence in [place].

## 2020-02-21 ENCOUNTER — Inpatient Hospital Stay: Payer: Federal, State, Local not specified - PPO | Admitting: Oncology

## 2020-02-26 ENCOUNTER — Other Ambulatory Visit: Payer: Self-pay | Admitting: *Deleted

## 2020-02-26 MED ORDER — LOSARTAN POTASSIUM 50 MG PO TABS: 50.0000 mg | ORAL_TABLET | Freq: Every day | ORAL | 0 refills | Status: DC

## 2020-02-26 MED ORDER — ATENOLOL 50 MG PO TABS: 50.0000 mg | ORAL_TABLET | Freq: Every day | ORAL | 0 refills | Status: DC

## 2020-02-26 MED FILL — LOSARTAN POTASSIUM 50 MG TA: 50 | 90 days supply | Qty: 90 | Fill #0

## 2020-02-26 MED FILL — ATENOLOL 50 MG TABLET: 50 | 90 days supply | Qty: 90 | Fill #0

## 2020-02-26 NOTE — Progress Notes (Signed)
Refill requests from [facility] requesting 90 day supply for atenolol and losartan.

## 2020-02-28 ENCOUNTER — Other Ambulatory Visit: Payer: Self-pay | Admitting: Oncology

## 2020-03-03 MED FILL — LENVIMA 10 MG DAILY DOSE: 10 | 30 days supply | Qty: 30 | Fill #0

## 2020-03-21 ENCOUNTER — Telehealth: Payer: Self-pay | Admitting: *Deleted

## 2020-03-21 NOTE — Telephone Encounter (Signed)
Received results from [facility] collected on 03/18/20--stable. MD requesting video or in person visit. Patient agreed to video visit on 03/26/20 at 0830. Scheduling message sent.

## 2020-03-26 ENCOUNTER — Telehealth: Payer: Self-pay | Admitting: *Deleted

## 2020-03-26 ENCOUNTER — Inpatient Hospital Stay: Payer: Federal, State, Local not specified - PPO | Attending: Oncology | Admitting: Oncology

## 2020-03-26 DIAGNOSIS — C73 Malignant neoplasm of thyroid gland: Secondary | ICD-10-CM | POA: Diagnosis not present

## 2020-03-26 NOTE — Telephone Encounter (Addendum)
Left VM requesting return call to confirm he wants levothyroxine sent to [facility]. He called back and reports he still has #90 supply on file from this by Dr. Buddy Duty. He will request Dr. Benay Spice refill when due.

## 2020-03-26 NOTE — Progress Notes (Signed)
[place] [facility] OFFICE VISIT PROGRESS NOTE  I connected with@ on 03/26/20 at  8:30 AM EDT by video and verified that I am speaking with the correct person using two identifiers.   I discussed the limitations, risks, security and privacy concerns of performing an evaluation and management service by telemedicine and the availability of in-person appointments. I also discussed with the patient that there may be a patient responsible charge related to this service. The patient expressed understanding and agreed to proceed.   Patient's location: Home Provider's location: Office   Diagnosis: Thyroid cancer  INTERVAL HISTORY:   [name] seen today for a telehealth visit.  This is per his request.  He continues to live in [place].  He resumed lenvatinib on 01/31/2020.  No rash or diarrhea.  He feels well.  Good appetite.  He reports his weight has stabilized.  He has mild discomfort in the right shoulder.  He thinks this may be related to using a camera.  He has increased pain at the left femur after stumbling recently.  He reports his blood pressure has been higher, generally 130-140/91 range.  He is taking atenolol and losartan.    Lab Results:  03/18/2020 at [facility]: Creatinine 0.79, bilirubin 0.5, AST 23, ALT 24, alkaline phosphatase 105, hemoglobin 15.6, white count 10.1, platelets 165, ANC 6.53  Thyroglobulin on 02/13/2020: 2807  Medications: I have reviewed the patient's current medications.  Assessment/Plan:  1. 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   Multiple destructive bone lesions including a large lesion at L3 with a soft tissue component encroaching on the thecal sac.   Status post total thyroidectomy 11/02/2012 with pathology confirming a papillary thyroid carcinoma, follicular variant.   Markedly elevated thyroglobulin level.    Followed by Dr. Buddy Duty, treated with radioactive iodine.  08/08/2017 iodine-131 scan-multiple sites of abnormal radioiodine accumulation compatible with osseous metastasis. Single new focus of abnormal tracer localization identified in a lateral mid left rib. Remaining sites of abnormal uptake including proximal left femur were identified on the previous exam.  01/05/2018 thyroglobulin 1523 (1186 on 06/30/2017)  04/07/2018 thyroglobulin level greater than 500  Initiation of Lenvatinib 04/17/2018  Thyroglobulin level stable 11/02/2019  Lenvima placed on hold 11/16/2019 secondary to elevated liver enzymes and proteinuria  Lymphedema resumed at a dose of 10 mg daily 01/31/2020 2. Pain secondary to the L3 mass with compression of the left L3 nerve root. The pain is also also related to the left iliac mass.He is status post palliative radiation to the lumbar spine and left iliac bone, completed 11/09/2012. 3. Tobacco use.We discussed smoking cessation. He is not ready to quit smoking. 4. Pain at the left upper leg-x-ray 03/24/2018 revealed a large lytic lesion in the proximal left femoral diaphysis.Referral to orthopedic surgery recommended. Patient declined. 5. Proteinuria 05/22/2018- potentially related tolenvatinib  6. hypertension- progressive while on thelenvatinib,the atenololwas increasedto twice daily, losartan added 06/12/2018, improved 7. Hyperpigmented mole at the right upper back noted 10/16/2018 8. Elevated liver enzymes January 2021-potentially related to [facility], referred to gastroenterology, normalized on 01/22/2020 9. Anemia January 2021-macrocytic, improved    Disposition: [name] resumed lenvatinib approximately 2 months ago.  He appears to be tolerating the lenvatinib well.  His overall clinical status appears stable.  He will continue lenvatinib at the current dose.  He will be scheduled for a lab visit to include a repeat thyroglobulin level in approximately 6  weeks.  He will be scheduled for a telehealth visit approximately 1 week after the labs are obtained.  [name] will be scheduled for in person office visit the next time he is in [place].  He will contact us a week prior to his next visit here.  I encouraged him to obtain an oncologist in [place] and he says that he has identified it an oncologist there and will schedule an appointment.  We reviewed his medication list.  He is taking vitamin B12 and vitamin D3 daily.   I discussed the assessment and treatment plan with the patient. The patient was provided an opportunity to ask questions and all were answered. The patient agreed with the plan and demonstrated an understanding of the instructions.   The patient was advised to call back or seek an in-person evaluation if the symptoms worsen or if the condition fails to improve as anticipated.  I provided 30 minutes of chart review, video, and documentation time during this encounter, and > 50% was spent counseling as documented under my assessment & plan.  Betsy Coder ANP/GNP-BC   03/26/2020 8:32 AM

## 2020-04-21 ENCOUNTER — Other Ambulatory Visit: Payer: Self-pay | Admitting: Oncology

## 2020-04-30 MED FILL — LEVOTHYROXINE 175 MCG TAB: 175 | 90 days supply | Qty: 90 | Fill #1

## 2020-05-06 ENCOUNTER — Telehealth: Payer: Self-pay | Admitting: Oncology

## 2020-05-06 NOTE — Telephone Encounter (Signed)
Scheduled appts per 6/22 secure chat msg from desk nurse to review 5/12 los and schedule appts accordingly. Pt confirmed appt date and time.

## 2020-05-08 ENCOUNTER — Telehealth: Payer: Self-pay | Admitting: *Deleted

## 2020-05-08 MED FILL — ATENOLOL 50 MG TABLET: 50 | 90 days supply | Qty: 90 | Fill #0

## 2020-05-08 NOTE — Telephone Encounter (Signed)
Called to report he rolled over in bed last night and fractured his femur. Is in [facility] and having surgery today. Physicians have stopped his lenvatinib. Wanted MD aware. Dr. Benay Spice notified.

## 2020-05-14 ENCOUNTER — Inpatient Hospital Stay: Payer: Federal, State, Local not specified - PPO | Admitting: Oncology

## 2020-05-14 ENCOUNTER — Telehealth: Payer: Self-pay | Admitting: *Deleted

## 2020-05-14 NOTE — Telephone Encounter (Addendum)
Called patient to see if he still wishes to have the video visit w/Dr. Benay Spice today? He declines due to expecting physical therapy to appear at any time. Will be transferred to rehab facility later in the week and he will call with name and phone # of facility. He is asking when should he resume the lenvatinib? Per Dr. Benay Spice: As long as surgeon thinks he has healed adequately, OK to resume lenvatinib. Patient notified

## 2020-05-22 MED FILL — LENVIMA 10 MG DAILY DOSE: 10 | 30 days supply | Qty: 30 | Fill #1

## 2020-06-06 ENCOUNTER — Telehealth: Payer: Self-pay | Admitting: *Deleted

## 2020-06-06 NOTE — Telephone Encounter (Addendum)
Called patient to f/u on status: Returned home from rehab stay 3 days ago. Walking in home w/walker. Will start outpatient rehab in about 2 weeks. Lenvatinib was resumed at 10 mg/day ~ 10 days ago. BP has been "creeping" up some--asking if he should adjust his atenolol or losartan? Informed him will make MD aware of his status--most likely needs video visit soon. MD still encourages him to get a local oncologist. Per Dr. Benay Spice: Schedule for video visit on 06/11/20 at 0830. High priority scheduling message sent.

## 2020-06-10 ENCOUNTER — Telehealth: Payer: Self-pay | Admitting: Oncology

## 2020-06-11 ENCOUNTER — Other Ambulatory Visit: Payer: Self-pay | Admitting: *Deleted

## 2020-06-11 ENCOUNTER — Inpatient Hospital Stay: Payer: Federal, State, Local not specified - PPO | Attending: Oncology | Admitting: Oncology

## 2020-06-11 DIAGNOSIS — C73 Malignant neoplasm of thyroid gland: Secondary | ICD-10-CM

## 2020-06-11 MED ORDER — IBUPROFEN 800 MG PO TABS
800.0000 mg | ORAL_TABLET | Freq: Every day | ORAL | 1 refills | Status: DC | PRN
Start: 2020-06-11 — End: 2020-09-15

## 2020-06-11 MED ORDER — TRAMADOL HCL 50 MG PO TABS: 50.0000 mg | ORAL_TABLET | Freq: Every day | ORAL | 0 refills | Status: DC | PRN

## 2020-06-11 MED FILL — traMADol HCL 50 MG TABS: 50 | 30 days supply | Qty: 30 | Fill #0

## 2020-06-11 MED FILL — IBUPROFEN 800 MG TAB: 800 | 30 days supply | Qty: 30 | Fill #0

## 2020-06-11 NOTE — Progress Notes (Signed)
[facility] OFFICE VISIT PROGRESS NOTE  I connected with Gregory Snow 06/11/20 at  8:30 AM EDT by  and verified that I am speaking with the correct person using two identifiers.   I discussed the limitations, risks, security and privacy concerns of performing an evaluation and management service by telemedicine and the availability of in-person appointments. I also discussed with the patient that there may be a patient responsible charge related to this service. The patient expressed understanding and agreed to proceed.    Patient's location: Home Provider's location: Office   Diagnosis: Thyroid cancer  INTERVAL HISTORY:   Gregory Snow is seen today for telehealth visit per his request.  He continues to reside in [place].  He fractured the left femur when getting out of bed on 05/07/2020 he was admitted to [facility] and underwent surgical fixation of the left femur.  He was discharged 05/15/2020 to a rehabilitation facility and has recently returned home. He continues to have pain at the left leg, but this has improved.  The pain is relieved by tramadol and ibuprofen.  He takes tramadol at night in order to relieve the pain from sleep.  He otherwise feels well.  He has continued lenvatinib at a dose of 10 mg daily.  He reports his blood pressure has been high at times, but was measured at 135/88 today.  He is taking antihypertensive medications.  He reports he was seen by an oncologist while in the hospital, but has not scheduled a follow-up visit.   Lab Results:  Lab Results  Component Value Date   WBC 9.0 10/16/2018   HGB 14.7 10/16/2018   HCT 43.6 10/16/2018   MCV 99.5 10/16/2018   PLT 188 10/16/2018   NEUTROABS 7.2 10/16/2018     Medications: I have reviewed the patient's current medications.  Assessment/Plan: 1. Metastaticpapillarythyroid cancer, palpable left thyroid mass confirmed on a  CT11/06/2012.   Multiple destructive bone lesions including a large lesion at L3 with a soft tissue component encroaching on the thecal sac.   Status post total thyroidectomy 11/02/2012 with pathology confirming a papillary thyroid carcinoma, follicular variant.   Markedly elevated thyroglobulin level.   Followed by Dr. Buddy Duty, treated with radioactive iodine.  08/08/2017 iodine-131 scan-multiple sites of abnormal radioiodine accumulation compatible with osseous metastasis. Single new focus of abnormal tracer localization identified in a lateral mid left rib. Remaining sites of abnormal uptake including proximal left femur were identified on the previous exam.  01/05/2018 thyroglobulin 1523 (1186 on 06/30/2017)  04/07/2018 thyroglobulin level greater than 500  Initiation of Lenvatinib 04/17/2018  Thyroglobulin level stable 11/02/2019  Lenvima placed on hold 11/16/2019 secondary to elevated liver enzymes and proteinuria  Lymphedema resumed at a dose of 10 mg daily 01/31/2020 2. Pain secondary to the L3 mass with compression of the left L3 nerve root. The pain is also also related to the left iliac mass.He is status post palliative radiation to the lumbar spine and left iliac bone, completed 11/09/2012. 3. Tobacco use.We discussed smoking cessation. He is not ready to quit smoking. 4. Pain at the left upper leg-x-ray 03/24/2018 revealed a large lytic lesion in the proximal left femoral diaphysis.Referral to orthopedic surgery recommended. Patient declined. 5. Proteinuria 05/22/2018- potentially related tolenvatinib  6. hypertension- progressive while on thelenvatinib,the atenololwas increasedto twice daily, losartan added 06/12/2018, improved 7. Hyperpigmented mole at the right upper back noted 10/16/2018 8. Elevated liver enzymes January 2021-potentially related to [place],  referred to gastroenterology, normalized on 01/22/2020 9. Anemia January 2021-macrocytic,  improved 10. Pathologic fracture of the left femur 05/07/2020-surgical fixation at [facility]   Disposition: [name] has metastatic thyroid cancer.  He is currently maintained on systemic therapy with lenvatinib.  He recently suffered a pathologic fracture of the left femur.  He continues to live in [place].  I encouraged him to obtain an oncologist in the [place] area as it is difficult to manage his case from a distance.  He needs a restaging bone scan and thyroglobulin level.  He will need to be treated with a different systemic therapy if there is additional evidence of disease progression.  I recommend biphosphonate therapy.  He indicates that he plans to stay in [place], but will seek an appointment with an oncologist there.  He will continue monitoring the blood pressure and contact us if the blood pressure is higher.  I refilled his prescription for tramadol.  He agrees to a follow-up telehealth visit in 1 month.   I discussed the assessment and treatment plan with the patient. The patient was provided an opportunity to ask questions and all were answered. The patient agreed with the plan and demonstrated an understanding of the instructions.   The patient was advised to call back or seek an in-person evaluation if the symptoms worsen or if the condition fails to improve as anticipated.  I provided 35 minutes of telephone, chart review, and documentation time during this encounter, and > 50% was spent counseling as documented under my assessment & plan.  Betsy Coder ANP/GNP-BC   06/11/2020 8:52 AM

## 2020-06-11 NOTE — Progress Notes (Signed)
Confirmed w/patient he wants new scripts sent to [facility] will ship to him.

## 2020-06-13 ENCOUNTER — Telehealth: Payer: Self-pay | Admitting: Oncology

## 2020-06-13 NOTE — Telephone Encounter (Signed)
Called pt to schedule appts per 7/28 los. Pt stated that he wants to schedule his follow up after he follows up with his orthopedic doctor. He stated he would call back once he set up an appt with orthopedic doc.

## 2020-06-18 ENCOUNTER — Other Ambulatory Visit: Payer: Self-pay | Admitting: Oncology

## 2020-06-30 MED FILL — LENVIMA 10 MG DAILY DOSE: 10 | 30 days supply | Qty: 30 | Fill #0

## 2020-07-30 ENCOUNTER — Other Ambulatory Visit: Payer: Self-pay | Admitting: Oncology

## 2020-07-30 MED FILL — LOSARTAN POTASSIUM 50 MG TA: 50 | 90 days supply | Qty: 90 | Fill #0

## 2020-07-30 MED FILL — ATENOLOL 50 MG TABLET: 50 | 90 days supply | Qty: 90 | Fill #0

## 2020-08-04 ENCOUNTER — Telehealth: Payer: Self-pay | Admitting: *Deleted

## 2020-08-04 MED ORDER — TRAMADOL HCL 50 MG PO TABS
50.0000 mg | ORAL_TABLET | Freq: Every day | ORAL | 0 refills | Status: DC | PRN
Start: 2020-08-04 — End: 2021-01-16

## 2020-08-04 MED FILL — IBUPROFEN 800 MG TAB: 800 | 30 days supply | Qty: 30 | Fill #1

## 2020-08-04 MED FILL — LENVIMA 10 MG DAILY DOSE: 10 | 30 days supply | Qty: 30 | Fill #1

## 2020-08-04 MED FILL — LEVOTHYROXINE 175 MCG TAB: 175 | 90 days supply | Qty: 90 | Fill #2

## 2020-08-04 MED FILL — traMADol HCL 50 MG TABS: 50 | 30 days supply | Qty: 30 | Fill #0

## 2020-08-04 NOTE — Telephone Encounter (Signed)
MD signed script for CBC/diff; CMP, TSH and thyroglobulin level (NOT antibody). Mailed to patient home for collection in [place] lab. He was notified and asks to cancel the 9/27 video visit until he has all his results in. Also refilled his Tramadol.

## 2020-08-11 ENCOUNTER — Inpatient Hospital Stay: Payer: Federal, State, Local not specified - PPO | Admitting: Oncology

## 2020-08-26 ENCOUNTER — Other Ambulatory Visit: Payer: Self-pay | Admitting: Oncology

## 2020-08-27 ENCOUNTER — Other Ambulatory Visit (HOSPITAL_COMMUNITY): Payer: Self-pay | Admitting: Oncology

## 2020-08-28 MED FILL — LENVIMA 10 MG DAILY DOSE: 10 | 30 days supply | Qty: 30 | Fill #0

## 2020-09-05 ENCOUNTER — Telehealth: Payer: Self-pay | Admitting: Oncology

## 2020-09-05 NOTE — Telephone Encounter (Signed)
Scheduled appointment per 10/22 sch msg. Spoke to patient who is aware of appointments.

## 2020-09-11 ENCOUNTER — Encounter: Payer: Self-pay | Admitting: *Deleted

## 2020-09-11 NOTE — Progress Notes (Signed)
Mailed copy of CBC,CMP and thyroglobulin level to his address in [place].

## 2020-09-15 ENCOUNTER — Inpatient Hospital Stay: Payer: Federal, State, Local not specified - PPO | Attending: Oncology | Admitting: Oncology

## 2020-09-15 ENCOUNTER — Other Ambulatory Visit: Payer: Self-pay | Admitting: *Deleted

## 2020-09-15 ENCOUNTER — Other Ambulatory Visit (HOSPITAL_COMMUNITY): Payer: Self-pay | Admitting: Oncology

## 2020-09-15 DIAGNOSIS — C73 Malignant neoplasm of thyroid gland: Secondary | ICD-10-CM

## 2020-09-15 MED ORDER — IBUPROFEN 800 MG PO TABS
800.0000 mg | ORAL_TABLET | Freq: Every day | ORAL | 1 refills | Status: DC | PRN
Start: 2020-09-15 — End: 2021-01-16

## 2020-09-15 MED ORDER — ALPRAZOLAM 0.5 MG PO TABS: ORAL_TABLET | ORAL | 0 refills | Status: DC

## 2020-09-15 MED FILL — IBUPROFEN 800 MG TAB: 800 | 30 days supply | Qty: 30 | Fill #0

## 2020-09-15 MED FILL — ALPRAZolam 0.5 MG TABS: 0.5 | 15 days supply | Qty: 30 | Fill #0

## 2020-09-15 NOTE — Progress Notes (Signed)
Patient requesting refill on his alprazolam and ibuprofen and wants scripts mailed to him in [place]. Mailed script for CBC/diff; UA, CMP, Thyroglobulin panel with thyroglobulin level week 10/27/20 for him to have drawn w/results faxed to [phone]. Also sent script for total body bone scan week of 10/27/20. Also mailed copy of last bone scan report to his home. Emailed [facility] radiology w/request to mail him a CD with images from last bone scan for comparison.

## 2020-09-15 NOTE — Progress Notes (Signed)
[facility] OFFICE VISIT PROGRESS NOTE  I connected with Gerda Diss on 09/15/20 at 10:00 AM EDT by video and verified that I am speaking with the correct person using two identifiers.   I discussed the limitations, risks, security and privacy concerns of performing an evaluation and management service by telemedicine and the availability of in-person appointments. I also discussed with the patient that there may be a patient responsible charge related to this service. The patient expressed understanding and agreed to proceed.    Patient's location: Home Provider's location: Office   Diagnosis: Thyroid cancer  INTERVAL HISTORY:   Mr. Rodeheaver is seen today for a video visit per his request.  He continues to have pain at the "hip "with full weightbearing.  No rest pain.  He has pain in his left shoulder that he relates to holding a camera.  No other pain.  Good appetite.  He reports his blood pressure runs in the 125-138/80-90 range.  He is taking Lenvima.  He request refills on Xanax and ibuprofen.  He is scheduled to see orthopedics later this week.  He is followed by physical therapy.    Lab Results:  Lab Results  Component Value Date   WBC 9.0 10/16/2018   HGB 14.7 10/16/2018   HCT 43.6 10/16/2018   MCV 99.5 10/16/2018   PLT 188 10/16/2018   NEUTROABS 7.2 10/16/2018   08/26/2020: Hemoglobin 15.6, platelets 192,000, white count 10.7, ANC 7.55, creatinine 0.86, bilirubin 0.6, TSH less than 0.008, thyroglobulin 7787 Medications: I have reviewed the patient's current medications.  Assessment/Plan: 1. 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   Multiple destructive bone lesions including a large lesion at L3 with a soft tissue component encroaching on the thecal sac.   Status post total thyroidectomy 11/02/2012 with pathology confirming a papillary thyroid carcinoma,  follicular variant.   Markedly elevated thyroglobulin level.   Followed by Dr. Buddy Duty, treated with radioactive iodine.  08/08/2017 iodine-131 scan-multiple sites of abnormal radioiodine accumulation compatible with osseous metastasis. Single new focus of abnormal tracer localization identified in a lateral mid left rib. Remaining sites of abnormal uptake including proximal left femur were identified on the previous exam.  01/05/2018 thyroglobulin 1523 (1186 on 06/30/2017)  04/07/2018 thyroglobulin level greater than 500  Initiation of Lenvatinib 04/17/2018  Thyroglobulin level stable 11/02/2019  Lenvima placed on hold 11/16/2019 secondary to elevated liver enzymes and proteinuria  Lymphedema resumed at a dose of 10 mg daily 01/31/2020  Thyroglobulin higher 08/26/2020 2. Pain secondary to the L3 mass with compression of the left L3 nerve root. The pain is also also related to the left iliac mass.He is status post palliative radiation to the lumbar spine and left iliac bone, completed 11/09/2012. 3. Tobacco use.We discussed smoking cessation. He is not ready to quit smoking. 4. Pain at the left upper leg-x-ray 03/24/2018 revealed a large lytic lesion in the proximal left femoral diaphysis.Referral to orthopedic surgery recommended. Patient declined. 5. Proteinuria 05/22/2018- potentially related tolenvatinib  6. hypertension- progressive while on thelenvatinib,the atenololwas increasedto twice daily, losartan added 06/12/2018, improved 7. Hyperpigmented mole at the right upper back noted 10/16/2018 8. Elevated liver enzymes January 2021-potentially related to [facility], referred to gastroenterology, normalized on 01/22/2020 9. Anemia January 2021-macrocytic, improved 10. Pathologic fracture of the left femur 05/07/2020-surgical fixation at [facility]    Disposition: Mr. Kunath has metastatic differentiated thyroid cancer.  He has been treated with  lenvatinib.  He appears to be tolerating the lenvatinib well.  The thyroglobulin level was higher last month.  I recommended he undergo a restaging bone scan prior to deciding on a change in treatment.  We discussed repeat treatment with radioactive iodine, but I think he is unlikely to respond as he had evidence of progressive disease following radioactive iodine given in September 2018.  He agrees to a follow-up video visit in December.  He will undergo a bone scan and repeat thyroglobulin level prior to this visit.  We refilled prescriptions for ibuprofen and Xanax.  He is scheduled to see orthopedics later this week.  I encouraged him to discuss a radiation oncology referral with the orthopedic physician.  He may benefit from radiation to the left femur.  I encouraged him to obtain a medical oncologist in [place].  He says he will work on scheduling an appointment prior to the video visit in December.   I discussed the assessment and treatment plan with the patient. The patient was provided an opportunity to ask questions and all were answered. The patient agreed with the plan and demonstrated an understanding of the instructions.   The patient was advised to call back or seek an in-person evaluation if the symptoms worsen or if the condition fails to improve as anticipated.  I provided 30 minutes of chart review, video, and documentation time during this encounter, and > 50% was spent counseling as documented under my assessment & plan.  Betsy Coder ANP/GNP-BC   09/15/2020 10:24 AM

## 2020-10-02 MED FILL — LENVIMA 10 MG DAILY DOSE: 10 | 30 days supply | Qty: 30 | Fill #1

## 2020-10-24 ENCOUNTER — Other Ambulatory Visit: Payer: Self-pay | Admitting: Oncology

## 2020-10-27 ENCOUNTER — Other Ambulatory Visit (HOSPITAL_COMMUNITY): Payer: Self-pay | Admitting: Oncology

## 2020-10-27 MED FILL — LEVOTHYROXINE 175 MCG TAB: 175 | 90 days supply | Qty: 90 | Fill #0

## 2020-10-27 MED FILL — LOSARTAN POTASSIUM 50 MG TA: 50 | 90 days supply | Qty: 90 | Fill #0

## 2020-10-27 MED FILL — ATENOLOL 50 MG TABLET: 50 | 90 days supply | Qty: 90 | Fill #0

## 2020-10-30 MED FILL — LENVIMA 10 MG DAILY DOSE: 10 | 30 days supply | Qty: 30 | Fill #0

## 2020-11-05 ENCOUNTER — Telehealth: Payer: Self-pay | Admitting: *Deleted

## 2020-11-05 NOTE — Telephone Encounter (Signed)
Inquired if he went to lab yet to have labs due last week drawn. He reports he went twice and waiting line was too long. He agrees to call when he has labs drawn.

## 2020-12-02 MED FILL — LENVIMA 10 MG DAILY DOSE: 10 | 30 days supply | Qty: 30 | Fill #1

## 2020-12-09 ENCOUNTER — Telehealth: Payer: Self-pay

## 2020-12-09 NOTE — Telephone Encounter (Signed)
Pt contacted to inform per Dr. Benay Spice that he needs to complete the following tasks: Lab work and bone scan, both of which he has prescriptions for. Once he completes those, then 1 week post bone scan Dr. Benay Spice will do a Video visit with pt. Following the video visit Dr. Benay Spice will renew [facility]. Pt verbalized understanding but stated due to Covid numbers he has been hesitant to get labs and bone scan done. Reinforced that pt must have these things done prior to refill. Instructed pt to call this office when he gets labs and scan done.

## 2020-12-11 ENCOUNTER — Other Ambulatory Visit: Payer: Self-pay | Admitting: Oncology

## 2020-12-15 ENCOUNTER — Other Ambulatory Visit (HOSPITAL_COMMUNITY): Payer: Self-pay | Admitting: Oncology

## 2020-12-15 ENCOUNTER — Telehealth: Payer: Self-pay | Admitting: Pharmacist

## 2020-12-15 ENCOUNTER — Telehealth: Payer: Self-pay | Admitting: *Deleted

## 2020-12-15 DIAGNOSIS — C73 Malignant neoplasm of thyroid gland: Secondary | ICD-10-CM

## 2020-12-15 MED ORDER — LENVIMA (10 MG DAILY DOSE) 10 MG PO CPPK
10.0000 mg | ORAL_CAPSULE | Freq: Every day | ORAL | 0 refills | Status: DC
Start: 2020-12-15 — End: 2021-01-07

## 2020-12-15 MED ORDER — LENVIMA (10 MG DAILY DOSE) 10 MG PO CPPK: 10.0000 mg | ORAL_CAPSULE | Freq: Every day | ORAL | 0 refills | Status: DC

## 2020-12-15 NOTE — Telephone Encounter (Signed)
Oral Oncology Pharmacist Encounter  Notified that patient's insurance now requires they fill Lenvima through [facility]. Prescription redirected to [facility].  Leron Croak, PharmD, BCPS Hematology/Oncology Clinical Pharmacist [facility] [phone] 12/15/2020 11:19 AM

## 2020-12-15 NOTE — Telephone Encounter (Signed)
Per Dr. Benay Spice: He agrees to fill lenvatinib x 1 only. Still needs to get his labs and bone scan prior to next refill. Received [facility]ed [facility]ill [facility] from [facility] [place] to inquire if his insurance has changed pharmacies? Was instructed to sent to [facility] per usual and if no longer able to be filled there, they will forward to appropriate pharmacy.

## 2020-12-19 ENCOUNTER — Telehealth: Payer: Self-pay

## 2020-12-19 NOTE — Telephone Encounter (Signed)
Returned call to pt regarding lab results. Per Dr Benay Spice once all lab results are back then a video visit will be arranged. Pt verbalized understanding.

## 2021-01-01 ENCOUNTER — Telehealth: Payer: Self-pay | Admitting: *Deleted

## 2021-01-01 NOTE — Telephone Encounter (Signed)
Called patient that lab results are available now (he has not had bone scan yet). Per Dr. Benay Spice: Can do video visit on 2/18 at 2:30 pm or 3/4 at 0800. He prefers the 3/4 at 0800. High priority scheduling message sent. He reports he is still using crutches and a walker. Hip gets painful with activity. Still in rehab.

## 2021-01-07 ENCOUNTER — Other Ambulatory Visit: Payer: Self-pay | Admitting: *Deleted

## 2021-01-07 DIAGNOSIS — C73 Malignant neoplasm of thyroid gland: Secondary | ICD-10-CM

## 2021-01-07 MED ORDER — LENVIMA (10 MG DAILY DOSE) 10 MG PO CPPK: 10.0000 mg | ORAL_CAPSULE | Freq: Every day | ORAL | 0 refills | Status: DC

## 2021-01-07 NOTE — Telephone Encounter (Signed)
Faxed refill request received from [facility] for lenvatinib

## 2021-01-16 ENCOUNTER — Inpatient Hospital Stay: Payer: Federal, State, Local not specified - PPO | Attending: Oncology | Admitting: Oncology

## 2021-01-16 ENCOUNTER — Other Ambulatory Visit: Payer: Self-pay | Admitting: *Deleted

## 2021-01-16 ENCOUNTER — Other Ambulatory Visit (HOSPITAL_COMMUNITY): Payer: Self-pay | Admitting: Oncology

## 2021-01-16 DIAGNOSIS — C73 Malignant neoplasm of thyroid gland: Secondary | ICD-10-CM

## 2021-01-16 MED ORDER — TRAMADOL HCL 50 MG PO TABS: 50.0000 mg | ORAL_TABLET | Freq: Every day | ORAL | 0 refills | Status: DC | PRN

## 2021-01-16 MED ORDER — LOSARTAN POTASSIUM 50 MG PO TABS: ORAL_TABLET | ORAL | 0 refills | Status: DC

## 2021-01-16 MED ORDER — ALPRAZOLAM 0.5 MG PO TABS: ORAL_TABLET | ORAL | 0 refills | Status: DC

## 2021-01-16 MED ORDER — LEVOTHYROXINE SODIUM 175 MCG PO TABS: 175.0000 ug | ORAL_TABLET | Freq: Every day | ORAL | 0 refills | Status: DC

## 2021-01-16 MED ORDER — IBUPROFEN 800 MG PO TABS: 800.0000 mg | ORAL_TABLET | Freq: Every day | ORAL | 1 refills | Status: DC | PRN

## 2021-01-16 MED FILL — LEVOTHYROXINE 175 MCG TAB: 175 | 90 days supply | Qty: 90 | Fill #0

## 2021-01-16 MED FILL — LOSARTAN POTASSIUM 50 MG TA: 50 | 30 days supply | Qty: 30 | Fill #0

## 2021-01-16 MED FILL — traMADol HCL 50 MG TABS: 50 | 30 days supply | Qty: 30 | Fill #0

## 2021-01-16 MED FILL — ALPRAZolam 0.5 MG TABS: 0.5 | 15 days supply | Qty: 30 | Fill #0

## 2021-01-16 NOTE — Progress Notes (Signed)
[facility] OFFICE VISIT PROGRESS NOTE  I connected with Gregory Snow on 01/16/21 at  8:00 AM EST by video and verified that I am speaking with the correct person using two identifiers.   I discussed the limitations, risks, security and privacy concerns of performing an evaluation and management service by telemedicine and the availability of in-person appointments. I also discussed with the patient that there may be a patient responsible charge related to this service. The patient expressed understanding and agreed to proceed.   Patient's location: Home Provider's location: Office   Diagnosis: Thyroid cancer  INTERVAL HISTORY:   Gregory Snow is seen today for a telehealth visit per his request.  He continues lenvatinib.  He reports feeling well.  Good appetite.  He continues to have discomfort at the left leg following the pathologic fracture surgery.  He has not regained full mobility.  He is followed by orthopedics and is ambulating with a cane.  He reports his blood pressure is running in the 120s/80s range.  No other complaint.  He has not scheduled a bone scan.  Objective:    Lab Results:  12/18/2020-thyroglobulin 8578, hemoglobin 14.6, WBC 10.3, ANC 7.2, creatinine 0.8, bilirubin 0.3 Medications: I have reviewed the patient's current medications.  Assessment/Plan: 1. 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   Multiple destructive bone lesions including a large lesion at L3 with a soft tissue component encroaching on the thecal sac.   Status post total thyroidectomy 11/02/2012 with pathology confirming a papillary thyroid carcinoma, follicular variant.   Markedly elevated thyroglobulin level.   Followed by Dr. Buddy Snow, treated with radioactive iodine.  08/08/2017 iodine-131 scan-multiple sites of abnormal radioiodine accumulation compatible with osseous metastasis.  Single new focus of abnormal tracer localization identified in a lateral mid left rib. Remaining sites of abnormal uptake including proximal left femur were identified on the previous exam.  01/05/2018 thyroglobulin 1523 (1186 on 06/30/2017)  04/07/2018 thyroglobulin level greater than 500  Initiation of Lenvatinib 04/17/2018  Thyroglobulin level stable 11/02/2019  Lenvima placed on hold 1/1/2021secondary to elevated liver enzymes and proteinuria  Lymphedema resumed at a dose of 10 mg daily 01/31/2020  Thyroglobulin higher 08/26/2020 2. Pain secondary to the L3 mass with compression of the left L3 nerve root. The pain is also also related to the left iliac mass.He is status post palliative radiation to the lumbar spine and left iliac bone, completed 11/09/2012. 3. Tobacco use.We discussed smoking cessation. He is not ready to quit smoking. 4. Pain at the left upper leg-x-ray 03/24/2018 revealed a large lytic lesion in the proximal left femoral diaphysis.Referral to orthopedic surgery recommended. Patient declined. 5. Proteinuria 05/22/2018-potentially related tolenvatinib  6. hypertension-progressive while on thelenvatinib,the atenololwas increasedto twice daily, losartan added 06/12/2018, improved 7. Hyperpigmented mole at the right upper back noted 10/16/2018 8. Elevated liver enzymes January 2021-potentially related to [facility], referred to gastroenterology, normalized on 01/22/2020 9. Anemia January 2021-macrocytic, improved 10. Pathologic fracture of the left femur 05/07/2020-surgical fixation at [facility]    Disposition: Gregory Snow has metastatic differentiated thyroid cancer.  He is maintained on lenvatinib.  He appears to be tolerating lenvatinib well and his overall clinical status appears unchanged.  He will continue lenvatinib.  I encouraged him to schedule a bone scan as soon as possible.  He will be scheduled for a lab and telehealth visit in 2  months.  I recommended he obtain an oncologist in the [facility]  area.  Gregory Snow says he will schedule the bone scan and for the images/report to [place].  He requested multiple refills today including refills on tramadol and Xanax.   I discussed the assessment and treatment plan with the patient. The patient was provided an opportunity to ask questions and all were answered. The patient agreed with the plan and demonstrated an understanding of the instructions.   The patient was advised to call back or seek an in-person evaluation if the symptoms worsen or if the condition fails to improve as anticipated.  I provided 35 minutes of video, chart review, and documentation time during this encounter, and > 50% was spent counseling as documented under my assessment & plan.  Gregory Snow ANP/GNP-BC   01/16/2021 7:50 AM

## 2021-01-16 NOTE — Telephone Encounter (Signed)
Per Dr. Benay Spice: Refilled synthroid, losartan and ibuprofen. Mailed script for CBC w/diff, CMP and Thyroglobulin level to be drawn in 6-7 weeks to his [place] address along with address and phone of new office location for this Spring.

## 2021-01-19 ENCOUNTER — Telehealth: Payer: Self-pay | Admitting: Oncology

## 2021-01-19 NOTE — Telephone Encounter (Signed)
Scheduled appointment per 3/4 los. Spoke to patient who is aware of appointment date and time.

## 2021-01-26 ENCOUNTER — Telehealth: Payer: Self-pay

## 2021-01-26 NOTE — Telephone Encounter (Signed)
Oral Oncology Patient Advocate Encounter  Received notification from [place] that prior authorization for Gregory Snow is required.  PA submitted on CoverMyMeds Key [id number] Status is pending  Oral Oncology Clinic will continue to follow.  [facility] Patient [place] Phone [phone] Fax [phone] 01/26/2021 11:17 AM

## 2021-01-27 NOTE — Telephone Encounter (Signed)
Oral Oncology Patient Advocate Encounter  Prior Authorization for [name] has been approved.    PA# [id number] Effective dates: 12/27/20 through 01/26/22   Oral Oncology Clinic will continue to follow.    [place] Patient Gregory Snow Phone [phone] Fax [phone] 01/27/2021 11:48 AM

## 2021-02-05 ENCOUNTER — Other Ambulatory Visit (HOSPITAL_BASED_OUTPATIENT_CLINIC_OR_DEPARTMENT_OTHER): Payer: Self-pay

## 2021-02-09 ENCOUNTER — Telehealth: Payer: Self-pay | Admitting: Oncology

## 2021-02-09 NOTE — Telephone Encounter (Signed)
Calendar & letter has been mailed to the patient with updated address for appointments at [facility]  

## 2021-02-10 ENCOUNTER — Telehealth: Payer: Self-pay

## 2021-02-10 NOTE — Telephone Encounter (Signed)
Faxed refill to [facility] [phone] for [facility] as previously prescribed by Dr. Benay Spice.  Received confirmation of receipt.  Sent to HIM for scan to chart.

## 2021-02-17 ENCOUNTER — Telehealth: Payer: Self-pay | Admitting: *Deleted

## 2021-02-17 NOTE — Telephone Encounter (Signed)
Will be faxing medical clearance form for Dr. Benay Spice to complete to let them know what they are allowed to do for him due to his thyroid cancer and lenvatinib. No rush. Return to [phone].

## 2021-02-18 ENCOUNTER — Telehealth: Payer: Self-pay | Admitting: *Deleted

## 2021-02-18 NOTE — Telephone Encounter (Signed)
MD signed clearance note for his dental extractions with directions (per pharmacy) to hold Lenvatinib for 1 week prior to procedure and hold ~ 2 weeks afterward until MD feels adequate healing has taken place. Faxed to [phone]

## 2021-03-10 ENCOUNTER — Telehealth: Payer: Self-pay | Admitting: *Deleted

## 2021-03-10 DIAGNOSIS — C73 Malignant neoplasm of thyroid gland: Secondary | ICD-10-CM

## 2021-03-10 MED ORDER — LENVIMA (10 MG DAILY DOSE) 10 MG PO CPPK: 10.0000 mg | ORAL_CAPSULE | Freq: Every day | ORAL | 0 refills | Status: DC

## 2021-03-10 NOTE — Telephone Encounter (Signed)
Received faxed refill request from [facility]. Spoke w/patient: still in process of his dental work. Needs more teeth pulled and will get upper plate. He has not had his labs yet and states he will go next week. Also, has not scheduled his bone scan yet. Confirmed his video visit with Dr. Benay Spice on 03/24/21 at 0900. Provided him new office phone #.

## 2021-03-18 ENCOUNTER — Other Ambulatory Visit (HOSPITAL_COMMUNITY): Payer: Self-pay

## 2021-03-18 ENCOUNTER — Other Ambulatory Visit: Payer: Self-pay | Admitting: Oncology

## 2021-03-19 ENCOUNTER — Other Ambulatory Visit (HOSPITAL_COMMUNITY): Payer: Self-pay

## 2021-03-20 ENCOUNTER — Encounter: Payer: Self-pay | Admitting: *Deleted

## 2021-03-20 ENCOUNTER — Other Ambulatory Visit (HOSPITAL_COMMUNITY): Payer: Self-pay

## 2021-03-20 ENCOUNTER — Other Ambulatory Visit: Payer: Self-pay | Admitting: Oncology

## 2021-03-20 NOTE — Progress Notes (Signed)
Received thyroid testing from [facility], however the incorrect test was run (did not run the thyroglobulin level). [facility] had not sent the sample to [facility] as usual. Spoke w/Dee at [facility] [phone], who says the correct lab should be able to be added on. Provided fax number to send results and RN direct number to call for any problem. Was provided test code: [phone] next time it is ordered to try to prevent error again.

## 2021-03-23 ENCOUNTER — Other Ambulatory Visit (HOSPITAL_COMMUNITY): Payer: Self-pay

## 2021-03-23 ENCOUNTER — Telehealth: Payer: Self-pay | Admitting: *Deleted

## 2021-03-23 ENCOUNTER — Other Ambulatory Visit: Payer: Self-pay | Admitting: *Deleted

## 2021-03-23 ENCOUNTER — Other Ambulatory Visit: Payer: Self-pay | Admitting: Oncology

## 2021-03-23 MED ORDER — ATENOLOL 50 MG PO TABS
ORAL_TABLET | ORAL | 0 refills | Status: DC
  Filled 2021-03-23: qty 90, 90d supply, fill #0

## 2021-03-23 MED ORDER — LOSARTAN POTASSIUM 50 MG PO TABS
ORAL_TABLET | ORAL | 0 refills | Status: DC
Start: 2021-03-23 — End: 2021-08-13
  Filled 2021-03-23 – 2021-05-13 (×2): qty 90, 90d supply, fill #0

## 2021-03-23 MED FILL — Losartan Potassium Tab 50 MG: ORAL | 30 days supply | Qty: 30 | Fill #0 | Status: CN

## 2021-03-23 MED FILL — Losartan Potassium Tab 50 MG: ORAL | 60 days supply | Qty: 60 | Fill #0 | Status: AC

## 2021-03-23 NOTE — Telephone Encounter (Signed)
Called [facility] to f/u on status of Thyroglobulin level that was supposed to be added on 03/20/21. Was informed they did not receive the proper sample tube to run this test. Needs serum tube and not serum gel tube. Dr. Benay Spice notified.

## 2021-03-24 ENCOUNTER — Encounter: Payer: Self-pay | Admitting: *Deleted

## 2021-03-24 ENCOUNTER — Other Ambulatory Visit: Payer: Self-pay | Admitting: *Deleted

## 2021-03-24 ENCOUNTER — Inpatient Hospital Stay: Payer: Federal, State, Local not specified - PPO | Attending: Oncology | Admitting: Oncology

## 2021-03-24 ENCOUNTER — Other Ambulatory Visit (HOSPITAL_COMMUNITY): Payer: Self-pay

## 2021-03-24 DIAGNOSIS — C73 Malignant neoplasm of thyroid gland: Secondary | ICD-10-CM

## 2021-03-24 NOTE — Progress Notes (Signed)
[place] OFFICE VISIT PROGRESS NOTE  I connected with Gregory Snow on 03/24/21 at  9:00 AM EDT by video and verified that I am speaking with the correct person using two identifiers.   I discussed the limitations, risks, security and privacy concerns of performing an evaluation and management service by telemedicine and the availability of in-person appointments. I also discussed with the patient that there may be a patient responsible charge related to this service. The patient expressed understanding and agreed to proceed.   Patient's location: Home Provider's location: Office   Diagnosis: Thyroid cancer  INTERVAL HISTORY:   Gregory Snow is seen today for a video visit per his request.  The visit started with video, but changed to a telephone visit due to technical difficulty. He continues Lenvima.  No diarrhea or rash.  He reports his blood pressure has been running in the 120/80 range.  He takes ibuprofen as needed for left hip discomfort.  He is not using hydrocodone.  The left leg pain is worse with weightbearing.  He is performing physical therapy at home.  He has chronic bilateral shoulder pain that is worse in the mornings and improves throughout the day.  He has not scheduled a bone scan.     Lab Results:  Lab Results  Component Value Date   WBC 9.0 10/16/2018   HGB 14.7 10/16/2018   HCT 43.6 10/16/2018   MCV 99.5 10/16/2018   PLT 188 10/16/2018   NEUTROABS 7.2 10/16/2018    Medications: I have reviewed the patient's current medications.  Assessment/Plan: 1. 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   Multiple destructive bone lesions including a large lesion at L3 with a soft tissue component encroaching on the thecal sac.   Status post total thyroidectomy 11/02/2012 with pathology confirming a papillary thyroid carcinoma, follicular variant.   Markedly  elevated thyroglobulin level.   Followed by Dr. Buddy Duty, treated with radioactive iodine.  08/08/2017 iodine-131 scan-multiple sites of abnormal radioiodine accumulation compatible with osseous metastasis. Single new focus of abnormal tracer localization identified in a lateral mid left rib. Remaining sites of abnormal uptake including proximal left femur were identified on the previous exam.  01/05/2018 thyroglobulin 1523 (1186 on 06/30/2017)  04/07/2018 thyroglobulin level greater than 500  Initiation of Lenvatinib 04/17/2018  Thyroglobulin level stable 11/02/2019  Lenvima placed on hold 1/1/2021secondary to elevated liver enzymes and proteinuria  Lymphedema resumed at a dose of 10 mg daily 01/31/2020  Thyroglobulin higher 08/26/2020 and 12/18/2020 2. Pain secondary to the L3 mass with compression of the left L3 nerve root. The pain is also also related to the left iliac mass.He is status post palliative radiation to the lumbar spine and left iliac bone, completed 11/09/2012. 3. Tobacco use.We discussed smoking cessation. He is not ready to quit smoking. 4. Pain at the left upper leg-x-ray 03/24/2018 revealed a large lytic lesion in the proximal left femoral diaphysis.Referral to orthopedic surgery recommended. Patient declined. 5. Proteinuria 05/22/2018-potentially related tolenvatinib  6. hypertension-progressive while on thelenvatinib,the atenololwas increasedto twice daily, losartan added 06/12/2018, improved 7. Hyperpigmented mole at the right upper back noted 10/16/2018 8. Elevated liver enzymes January 2021-potentially related to [facility], referred to gastroenterology, normalized on 01/22/2020 9. Anemia January 2021-macrocytic, improved 10. Pathologic fracture of the left femur 05/07/2020-surgical fixation at [facility]     Disposition: Gregory Snow has metastatic papillary thyroid cancer.  He continues lenvatinib.  His overall clinical status  appears unchanged, though he has not undergone a restaging bone scan and did not have a thyroglobulin level with the lab draw last week. I encouraged him to schedule a restaging bone scan.  I encouraged Gregory Snow to obtain a medical oncologist and primary physician in the [place] area.  He will continue the current antihypertensive regimen.  He says that he will schedule a bone scan prior to a video visit in 3 months.  We will request labs to include a thyroglobulin level and TSH be drawn approximately 1 week prior to the next office visit.   I discussed the assessment and treatment plan with the patient. The patient was provided an opportunity to ask questions and all were answered. The patient agreed with the plan and demonstrated an understanding of the instructions.   The patient was advised to call back or seek an in-person evaluation if the symptoms worsen or if the condition fails to improve as anticipated.  I provided 25 minutes of video, telephone, chart review, and documentation time during this encounter, and > 50% was spent counseling as documented under my assessment & plan.  Betsy Coder ANP/GNP-BC   03/24/2021 8:40 AM

## 2021-03-24 NOTE — Progress Notes (Unsigned)
Mailed lab orders to patient in [place] for CBC/diff; CMet, TSH and Thyroglobulin level (code [phone]) to be completed 1st week of August.

## 2021-03-31 ENCOUNTER — Other Ambulatory Visit: Payer: Self-pay | Admitting: *Deleted

## 2021-03-31 NOTE — Progress Notes (Signed)
Received fax from [facility] reporting potential nonadherent to treatment w/losartan due to refill history. Will save for MD visit to discuss.

## 2021-04-02 ENCOUNTER — Other Ambulatory Visit: Payer: Self-pay | Admitting: Oncology

## 2021-04-02 DIAGNOSIS — C73 Malignant neoplasm of thyroid gland: Secondary | ICD-10-CM

## 2021-05-13 ENCOUNTER — Other Ambulatory Visit: Payer: Self-pay | Admitting: Oncology

## 2021-05-13 ENCOUNTER — Other Ambulatory Visit (HOSPITAL_COMMUNITY): Payer: Self-pay

## 2021-05-19 ENCOUNTER — Other Ambulatory Visit (HOSPITAL_COMMUNITY): Payer: Self-pay

## 2021-05-19 ENCOUNTER — Other Ambulatory Visit: Payer: Self-pay | Admitting: Oncology

## 2021-05-19 MED FILL — Levothyroxine Sodium Tab 175 MCG: ORAL | 90 days supply | Qty: 90 | Fill #0 | Status: AC

## 2021-05-20 ENCOUNTER — Other Ambulatory Visit (HOSPITAL_COMMUNITY): Payer: Self-pay

## 2021-05-25 ENCOUNTER — Other Ambulatory Visit (HOSPITAL_COMMUNITY): Payer: Self-pay

## 2021-05-25 MED ORDER — ATENOLOL 50 MG PO TABS
ORAL_TABLET | ORAL | 0 refills | Status: DC
  Filled 2021-05-25: qty 90, fill #0
  Filled 2021-06-25: qty 90, 90d supply, fill #0

## 2021-06-25 ENCOUNTER — Other Ambulatory Visit (HOSPITAL_COMMUNITY): Payer: Self-pay

## 2021-07-01 ENCOUNTER — Inpatient Hospital Stay: Payer: Federal, State, Local not specified - PPO | Attending: Oncology | Admitting: Oncology

## 2021-07-01 ENCOUNTER — Other Ambulatory Visit: Payer: Self-pay

## 2021-07-01 ENCOUNTER — Other Ambulatory Visit (HOSPITAL_COMMUNITY): Payer: Self-pay

## 2021-07-01 DIAGNOSIS — C73 Malignant neoplasm of thyroid gland: Secondary | ICD-10-CM | POA: Diagnosis not present

## 2021-07-01 MED ORDER — HYDROCODONE-ACETAMINOPHEN 10-325 MG PO TABS
1.0000 | ORAL_TABLET | Freq: Four times a day (QID) | ORAL | 0 refills | Status: DC | PRN
  Filled 2021-07-01: qty 60, 15d supply, fill #0

## 2021-07-01 MED ORDER — ALPRAZOLAM 0.5 MG PO TABS
ORAL_TABLET | ORAL | 0 refills | Status: DC
  Filled 2021-07-01: qty 30, 15d supply, fill #0

## 2021-07-01 MED ORDER — TRAMADOL HCL 50 MG PO TABS
50.0000 mg | ORAL_TABLET | Freq: Every day | ORAL | 0 refills | Status: DC | PRN
  Filled 2021-07-01: qty 30, 30d supply, fill #0

## 2021-07-01 NOTE — Progress Notes (Signed)
Gregory Snow OFFICE VISIT PROGRESS NOTE  I connected with Gregory Snow on 07/01/21 at  1:40 PM EDT by  and verified that I am speaking with the correct person using two identifiers.   I discussed the limitations, risks, security and privacy concerns of performing an evaluation and management service by telemedicine and the availability of in-person appointments. I also discussed with the patient that there may be a patient responsible charge related to this service. The patient expressed understanding and agreed to proceed.   Patient's location: Home Provider's location: Office   Diagnosis: Thyroid cancer  INTERVAL HISTORY:   Gregory Snow is seen today for a scheduled video visit per his request.  I was able to see him on the video, but he could not view my image. He continues lenvatinib.  No rash or diarrhea.  He reports dry skin.  His blood pressure has generally been measured in the 120/80 range, but has been elevated to 133/88 over the past week.  He relates this to increased left shoulder and lower back pain.  He takes ibuprofen once daily for relief of pain.  He would like a refill on tramadol and hydrocodone to take as needed. He has increased walking in the home.  He is using an elliptical machine. Good appetite. He has not completed the bone scan.  He is unable to return to [place] at present.  Objective:   Lab Results:  [facility] 06/18/2021: Thyroglobulin 8912, TSH 0.008, creatinine 0.76, bilirubin 0.5, AST 16, ALT 9, alkaline phosphatase 96, hemoglobin 13.8, WBC 9.8, ANC 7.03, platelets 180,000    Medications: I have reviewed the patient's current medications.  Assessment/Plan: Metastatic papillary thyroid cancer, palpable left thyroid mass confirmed on a CT 09/22/2012.   Multiple destructive bone lesions including a large lesion at L3 with a soft tissue component encroaching on the thecal sac.   Status post  total thyroidectomy 11/02/2012 with pathology confirming a papillary thyroid carcinoma, follicular variant.   Markedly elevated thyroglobulin level.   Followed by Dr. Buddy Duty, treated with radioactive iodine.   08/08/2017 iodine-131 scan- multiple sites of abnormal radioiodine accumulation compatible with osseous metastasis.  Single new focus of abnormal tracer localization identified in a lateral mid left rib.  Remaining sites of abnormal uptake including proximal left femur were identified on the previous exam. 01/05/2018 thyroglobulin 1523 (1186 on 06/30/2017) 04/07/2018 thyroglobulin level greater than 500 Initiation of Lenvatinib 04/17/2018 Thyroglobulin level stable 11/02/2019 Lenvima placed on hold 11/16/2019 secondary to elevated liver enzymes and proteinuria Lymphedema resumed at a dose of 10 mg daily 01/31/2020 Thyroglobulin higher 08/26/2020 and 12/18/2020, stable 06/18/2021 Pain secondary to the L3 mass with compression of the left L3 nerve root. The pain is also also related to the left iliac mass.  He is status post palliative radiation to the lumbar spine and left iliac bone, completed 11/09/2012. Tobacco use.  We discussed smoking cessation.  He is not ready to quit smoking. Pain at the left upper leg-x-ray 03/24/2018 revealed a large lytic lesion in the proximal left femoral diaphysis.  Referral to orthopedic surgery recommended.  Patient declined. Proteinuria 05/22/2018- potentially related to lenvatinib  hypertension- progressive while on the lenvatinib,the atenolol was increased to twice daily, losartan added 06/12/2018, improved Hyperpigmented mole at the right upper back noted 10/16/2018 Elevated liver enzymes January 2021-potentially related to [facility], referred to gastroenterology, normalized on 01/22/2020 Anemia January 2021-macrocytic, improved Pathologic fracture of the left femur 05/07/2020-surgical fixation at [facility]        Disposition: Gregory Litwak  continues lenvatinib.  He appears to be tolerating the treatment well.  I encouraged him to proceed with the restaging bone scan.  We discussed NGS testing to see whether he may be eligible for a targeted therapy.  When he returns to [facility] we will submit peripheral blood guardant 360 testing.  He will call for persistent/increased pain in the lower back and shoulder.  I refilled his prescriptions for hydrocodone, tramadol, and Xanax today.  He will mail a handicap license plate sticker for [place] to sign.  I again encouraged him to obtain an oncologist in [place].  He will be scheduled for a video visit in 3 months.  He will have lab testing approximately 2 weeks prior to the next visit.     I discussed the assessment and treatment plan with the patient. The patient was provided an opportunity to ask questions and all were answered. The patient agreed with the plan and demonstrated an understanding of the instructions.   The patient was advised to call back or seek an in-person evaluation if the symptoms worsen or if the condition fails to improve as anticipated.  I provided 30 minutes video, chart review, and documentation time during this encounter, and > 50% was spent counseling as documented under my assessment & plan.  Betsy Coder ANP/GNP-BC   07/01/2021 2:02 PM

## 2021-07-02 ENCOUNTER — Encounter: Payer: Self-pay | Admitting: *Deleted

## 2021-07-02 NOTE — Progress Notes (Signed)
Mailed script for following labs to be drawn in [facility] 09/2021: CBC/diff, CMP, TSH and Thyroglobulin level (code [id number]) and faxed to [phone]. Also mailed handicap parking signed application.

## 2021-07-06 ENCOUNTER — Other Ambulatory Visit: Payer: Self-pay | Admitting: Oncology

## 2021-07-06 DIAGNOSIS — C73 Malignant neoplasm of thyroid gland: Secondary | ICD-10-CM

## 2021-08-13 ENCOUNTER — Other Ambulatory Visit: Payer: Self-pay | Admitting: Oncology

## 2021-08-13 ENCOUNTER — Other Ambulatory Visit (HOSPITAL_COMMUNITY): Payer: Self-pay

## 2021-08-13 MED ORDER — LOSARTAN POTASSIUM 50 MG PO TABS
ORAL_TABLET | ORAL | 0 refills | Status: DC
  Filled 2021-08-13: qty 90, 90d supply, fill #0

## 2021-08-13 MED FILL — Levothyroxine Sodium Tab 175 MCG: ORAL | 90 days supply | Qty: 90 | Fill #1 | Status: AC

## 2021-08-14 ENCOUNTER — Other Ambulatory Visit (HOSPITAL_COMMUNITY): Payer: Self-pay

## 2021-09-21 ENCOUNTER — Other Ambulatory Visit (HOSPITAL_COMMUNITY): Payer: Self-pay

## 2021-09-21 ENCOUNTER — Other Ambulatory Visit: Payer: Self-pay | Admitting: Oncology

## 2021-09-22 ENCOUNTER — Other Ambulatory Visit (HOSPITAL_COMMUNITY): Payer: Self-pay

## 2021-09-22 MED ORDER — ATENOLOL 50 MG PO TABS
50.0000 mg | ORAL_TABLET | Freq: Every day | ORAL | 0 refills | Status: DC
  Filled 2021-09-22: qty 90, 90d supply, fill #0

## 2021-10-05 ENCOUNTER — Telehealth: Payer: Self-pay | Admitting: *Deleted

## 2021-10-05 ENCOUNTER — Other Ambulatory Visit: Payer: Self-pay | Admitting: Oncology

## 2021-10-05 DIAGNOSIS — C73 Malignant neoplasm of thyroid gland: Secondary | ICD-10-CM

## 2021-10-05 NOTE — Telephone Encounter (Signed)
Left VM reminding patient to have his 3 months labs done.

## 2021-10-07 ENCOUNTER — Encounter: Payer: Self-pay | Admitting: *Deleted

## 2021-10-07 ENCOUNTER — Telehealth: Payer: Self-pay | Admitting: *Deleted

## 2021-10-07 NOTE — Telephone Encounter (Signed)
Called to report he found his prescription for lab work and will get it done soon. Also wants to have bone scan now. Asking if the script written 1 year ago is still valid. Suggested he call to schedule the scan and ask. IF they need a new script get the fax # and we can send new script there.  Also wanted MD aware that he has new diagnosis of glaucoma in right eye--on drops now. Has an appointment w/ophthalmologist in January, but is on wait list. Asking if this is related to his cancer and treatment?

## 2021-10-22 ENCOUNTER — Inpatient Hospital Stay: Payer: Federal, State, Local not specified - PPO | Admitting: Oncology

## 2021-11-06 ENCOUNTER — Other Ambulatory Visit: Payer: Self-pay | Admitting: Oncology

## 2021-11-06 ENCOUNTER — Other Ambulatory Visit (HOSPITAL_COMMUNITY): Payer: Self-pay

## 2021-11-06 MED FILL — Levothyroxine Sodium Tab 175 MCG: ORAL | 90 days supply | Qty: 90 | Fill #0 | Status: AC

## 2021-11-06 MED FILL — Losartan Potassium Tab 50 MG: ORAL | 90 days supply | Qty: 90 | Fill #0 | Status: AC

## 2021-11-11 ENCOUNTER — Other Ambulatory Visit (HOSPITAL_COMMUNITY): Payer: Self-pay

## 2021-11-20 ENCOUNTER — Inpatient Hospital Stay: Payer: Federal, State, Local not specified - PPO | Attending: Oncology | Admitting: Oncology

## 2021-11-20 DIAGNOSIS — C73 Malignant neoplasm of thyroid gland: Secondary | ICD-10-CM | POA: Diagnosis not present

## 2021-11-20 NOTE — Progress Notes (Signed)
[place] OFFICE VISIT PROGRESS NOTE  I connected with Gregory Snow on 11/20/21 at 11:20 AM EST by video and verified that I am speaking with the correct person using two identifiers.   I discussed the limitations, risks, security and privacy concerns of performing an evaluation and management service by telemedicine and the availability of in-person appointments. I also discussed with the patient that there may be a patient responsible charge related to this service. The patient expressed understanding and agreed to proceed.   Patient's location: Home Provider's location: Office    Diagnosis: Thyroid cancer  INTERVAL HISTORY:   Gregory Snow is seen today for a telehealth visit per his request.  The visit started as a video visit and converted to a phone visit due to difficulty with the computer connection.  Gregory Snow continues to live in [place].  Gregory Snow is taking Lenvima.  Gregory Snow reports his blood pressure was up to 160/97 recently, but was measured at 134/80 today.  Gregory Snow has dry skin on the right leg.  Gregory Snow continues to have discomfort in the "hip "with ambulation.  Gregory Snow ambulates with a cane and walker.  Gregory Snow has chronic pain at the left shoulder and intermittent low back pain.  Gregory Snow takes one half of a hydrocodone tablet occasionally.  Gregory Snow takes an ibuprofen when Gregory Snow goes out of the house.  Gregory Snow has not completed a restaging bone scan. Gregory Snow reports Gregory Snow recently developed diminished vision in the right eye.  Gregory Snow was diagnosed with glaucoma and is taking drops.  Gregory Snow is scheduled to see an ophthalmologist.  Labs: Laboratory studies from [facility] 11/13/2021-reviewed   Medications: I have reviewed the patient's current medications.  Assessment/Plan: Metastatic papillary thyroid cancer, palpable left thyroid mass confirmed on a CT 09/22/2012.   Multiple destructive bone lesions including a large lesion at L3 with a soft tissue component encroaching on the thecal  sac.   Status post total thyroidectomy 11/02/2012 with pathology confirming a papillary thyroid carcinoma, follicular variant.   Markedly elevated thyroglobulin level.   Followed by Dr. Buddy Duty, treated with radioactive iodine.   08/08/2017 iodine-131 scan- multiple sites of abnormal radioiodine accumulation compatible with osseous metastasis.  Single new focus of abnormal tracer localization identified in a lateral mid left rib.  Remaining sites of abnormal uptake including proximal left femur were identified on the previous exam. 01/05/2018 thyroglobulin 1523 (1186 on 06/30/2017) 04/07/2018 thyroglobulin level greater than 500 Initiation of Lenvatinib 04/17/2018 Thyroglobulin level stable 11/02/2019 Lenvima placed on hold 11/16/2019 secondary to elevated liver enzymes and proteinuria Lymphedema resumed at a dose of 10 mg daily 01/31/2020 Thyroglobulin higher 08/26/2020 and 12/18/2020, stable 06/18/2021 Pain secondary to the L3 mass with compression of the left L3 nerve root. The pain is also also related to the left iliac mass.  Gregory Snow is status post palliative radiation to the lumbar spine and left iliac bone, completed 11/09/2012. Tobacco use.  We discussed smoking cessation.  Gregory Snow is not ready to quit smoking. Pain at the left upper leg-x-ray 03/24/2018 revealed a large lytic lesion in the proximal left femoral diaphysis.  Referral to orthopedic surgery recommended.  Patient declined. Proteinuria 05/22/2018- potentially related to lenvatinib  hypertension- progressive while on the lenvatinib,the atenolol was increased to twice daily, losartan added 06/12/2018, improved Hyperpigmented mole at the right upper back noted 10/16/2018 Elevated liver enzymes January 2021-potentially related to [facility], referred to gastroenterology, normalized on 01/22/2020 Anemia January 2021-macrocytic, improved Pathologic fracture of the left femur 05/07/2020-surgical fixation  at [facility]       Disposition:  Gregory Snow has metastatic thyroid cancer.  Gregory Snow is being treated with Lenvima.  There is no apparent evidence of disease progression.  We will follow-up on the thyroglobulin level from last week.  I recommended Gregory Snow proceed with the restaging bone scan as ordered.  We will discontinue Lenvima for progressive disease on the bone scan or a consistent rise in the thyroglobulin level.  I am not aware of an association of glaucoma with the use of lenvatinib.  I asked him to have his ophthalmologist contact me if there is a concern for the glaucoma or decreased vision loss being related to the thyroid cancer.  Gregory Snow will be scheduled for a video visit in 3 months.  Gregory Snow will have labs 1 week prior to the video visit.  I discussed the assessment and treatment plan with the patient. The patient was provided an opportunity to ask questions and all were answered. The patient agreed with the plan and demonstrated an understanding of the instructions.   The patient was advised to call back or seek an in-person evaluation if the symptoms worsen or if the condition fails to improve as anticipated.  I provided 25 minutes of chart review, video/telephone, and documentation time during this encounter, and > 50% was spent counseling as documented under my assessment & plan.  Betsy Coder MD  11/20/2021 12:30 PM

## 2021-12-02 ENCOUNTER — Telehealth: Payer: Self-pay | Admitting: *Deleted

## 2021-12-02 NOTE — Telephone Encounter (Signed)
Left VM for Gregory Snow that after speaking w/[facility] it was determined not enough blood to run the thyroglobulin test and they failed to call him to recollect. Informed him a new script will be mailed to his home for the lab test w/code [id number] [facility]. Will also include copy of the last thyroglobulin by [facility] that was done correctly there to get the correct test ordered. Requested he send Mychart message as to when he had blood re-collected.

## 2021-12-11 ENCOUNTER — Other Ambulatory Visit (HOSPITAL_COMMUNITY): Payer: Self-pay

## 2021-12-11 ENCOUNTER — Other Ambulatory Visit: Payer: Self-pay | Admitting: Oncology

## 2021-12-11 MED ORDER — ATENOLOL 50 MG PO TABS
50.0000 mg | ORAL_TABLET | Freq: Every day | ORAL | 0 refills | Status: DC
  Filled 2021-12-11: qty 90, 90d supply, fill #0

## 2021-12-11 MED ORDER — IBUPROFEN 800 MG PO TABS
ORAL_TABLET | ORAL | 1 refills | Status: DC
  Filled 2021-12-11: qty 30, 30d supply, fill #0

## 2021-12-14 ENCOUNTER — Telehealth: Payer: Self-pay | Admitting: Pharmacy Technician

## 2021-12-14 NOTE — Telephone Encounter (Signed)
Oral Oncology Patient Advocate Encounter   Received notification from [place] that the existing prior authorization for Gregory Snow is due for renewal.   Renewal PA faxed to [phone]. Status is pending   Oral Oncology Clinic will continue to follow.  [place] Patient [address] Phone [phone] Fax [phone] 12/14/2021 9:36 AM

## 2021-12-15 NOTE — Telephone Encounter (Signed)
Oral Oncology Patient Advocate Encounter  Prior Authorization for Gregory Snow has been approved.    Effective dates: 11/14/21 through 12/14/22  Oral Oncology Clinic will continue to follow.   [place] Patient [name] Phone [phone] Fax [phone] 12/15/2021 8:23 AM

## 2021-12-21 ENCOUNTER — Encounter: Payer: Self-pay | Admitting: *Deleted

## 2022-01-04 ENCOUNTER — Encounter: Payer: Self-pay | Admitting: *Deleted

## 2022-01-04 NOTE — Progress Notes (Signed)
Mailed patient script w/lab orders for early April: CBC/diff, CMP, TSH and thyroglobulin by [facility] order w/results being faxed to [phone]. Left patient note to have them drawn to result by his April appointment.

## 2022-01-07 ENCOUNTER — Other Ambulatory Visit: Payer: Self-pay | Admitting: Oncology

## 2022-01-07 DIAGNOSIS — C73 Malignant neoplasm of thyroid gland: Secondary | ICD-10-CM

## 2022-02-11 ENCOUNTER — Other Ambulatory Visit: Payer: Self-pay | Admitting: Oncology

## 2022-02-11 ENCOUNTER — Other Ambulatory Visit (HOSPITAL_COMMUNITY): Payer: Self-pay

## 2022-02-11 MED ORDER — LOSARTAN POTASSIUM 50 MG PO TABS
ORAL_TABLET | ORAL | 0 refills | Status: DC
  Filled 2022-02-11: qty 90, 90d supply, fill #0

## 2022-02-11 MED ORDER — ATENOLOL 50 MG PO TABS
50.0000 mg | ORAL_TABLET | Freq: Every day | ORAL | 0 refills | Status: DC
  Filled 2022-02-11: qty 90, 90d supply, fill #0

## 2022-02-11 MED ORDER — LEVOTHYROXINE SODIUM 175 MCG PO TABS
ORAL_TABLET | ORAL | 0 refills | Status: DC
  Filled 2022-02-11: qty 90, 90d supply, fill #0

## 2022-02-15 ENCOUNTER — Telehealth: Payer: Self-pay | Admitting: *Deleted

## 2022-02-15 DIAGNOSIS — C73 Malignant neoplasm of thyroid gland: Secondary | ICD-10-CM

## 2022-02-15 MED ORDER — IBUPROFEN 800 MG PO TABS: 800.0000 mg | ORAL_TABLET | Freq: Every day | ORAL | 1 refills | Status: DC | PRN

## 2022-02-15 NOTE — Telephone Encounter (Signed)
Informed patient that MD wants him to be seen in person by a physician--can be his orthopedic physician or go to emergency room. He will not order scan without seeing him in person. Strongly encouraged him to get a local oncologist. Reminded him that last in person visit with Dr. Benay Spice was 05/16/20 and this is not in his best interest. He agrees and admits he has been "slacking" in getting an oncologist. He will reach out to ortho to see him. Dr. Benay Spice will refill his Ibuprofen and Hydrocodone later today. ?

## 2022-02-15 NOTE — Telephone Encounter (Signed)
Over past 3 weeks has developed a sharp, stabbing pain left back below the shoulder blade. Mild pain at rest, but movement can cause it to be "7-8" in intensity. Has been alternating Ibuprofen 800 mg and Hydrocodone 10-325 throughout the day (takes each med bid) with some relief. Asking what type of scan can be done to assess this area? Does not think he can lay on table for 1 hour to do a bone scan. Also asking if he can have an earlier video visit with Dr. Benay Spice than 4/18? ?Added that he re-injured his left leg about 6 weeks ago "flinging my legs off the ottoman" and felt a "pop". This is getting better. ?Needs MD to refill his Ibuprofen 800 mg and Hydrocodone 10-325 at [place]. ?

## 2022-02-16 ENCOUNTER — Other Ambulatory Visit (HOSPITAL_COMMUNITY): Payer: Self-pay

## 2022-02-16 ENCOUNTER — Other Ambulatory Visit: Payer: Self-pay | Admitting: Oncology

## 2022-02-16 DIAGNOSIS — C73 Malignant neoplasm of thyroid gland: Secondary | ICD-10-CM

## 2022-02-16 MED ORDER — HYDROCODONE-ACETAMINOPHEN 10-325 MG PO TABS
1.0000 | ORAL_TABLET | Freq: Four times a day (QID) | ORAL | 0 refills | Status: DC | PRN
  Filled 2022-02-16: qty 60, 15d supply, fill #0

## 2022-02-17 ENCOUNTER — Other Ambulatory Visit (HOSPITAL_COMMUNITY): Payer: Self-pay

## 2022-03-01 ENCOUNTER — Telehealth: Payer: Self-pay | Admitting: *Deleted

## 2022-03-01 DIAGNOSIS — C73 Malignant neoplasm of thyroid gland: Secondary | ICD-10-CM

## 2022-03-01 NOTE — Telephone Encounter (Addendum)
[name] reports he has found an endocrinologist in [place] that also has a lot of experience with thyroid cancer. Asking for referral to be sent to his office. ?Dr. Larena Sox at phone [phone] and fax [phone]. Wishes office to reach out to his girlfriend, Gregory Snow with the appointment since her phone is more reliable than his. ?Dr. Benay Spice agrees to this referral. ?Referral order, demographics, pathology, op note, recent labs and Dr. Gearldine Shown 1st and last office note faxed w/confirmation received. ?

## 2022-03-02 ENCOUNTER — Inpatient Hospital Stay: Payer: Federal, State, Local not specified - PPO | Attending: Oncology | Admitting: Oncology

## 2022-03-02 ENCOUNTER — Other Ambulatory Visit (HOSPITAL_COMMUNITY): Payer: Self-pay

## 2022-03-02 ENCOUNTER — Other Ambulatory Visit: Payer: Self-pay | Admitting: *Deleted

## 2022-03-02 DIAGNOSIS — C73 Malignant neoplasm of thyroid gland: Secondary | ICD-10-CM | POA: Diagnosis not present

## 2022-03-02 MED ORDER — LENVIMA (10 MG DAILY DOSE) 10 MG PO CPPK: ORAL_CAPSULE | ORAL | 1 refills | Status: DC

## 2022-03-02 MED ORDER — IBUPROFEN 800 MG PO TABS
800.0000 mg | ORAL_TABLET | Freq: Every day | ORAL | 1 refills | Status: DC | PRN
  Filled 2022-03-02: qty 30, 30d supply, fill #0

## 2022-03-02 NOTE — Progress Notes (Signed)
?[facility] ? ? ?HEMATOLOGY- ONCOLOGY TeleHEALTH OFFICE VISIT PROGRESS NOTE ? ?I connected with  Gregory Snow 03/02/22 at  3:00 PM EDT by video and verified that I am speaking with the correct person using two identifiers. ?  ?I discussed the limitations, risks, security and privacy concerns of performing an evaluation and management service by telemedicine and the availability of in-person appointments. I also discussed with the patient that there may be a patient responsible charge related to this service. The patient expressed understanding and agreed to proceed. ? ? ? ?Patient's location: Home ?Provider's location: Office ? ?Diagnosis: Thyroid cancer ? ?INTERVAL HISTORY:  ? ?Gregory Snow is seen today for a telehealth visit per his request.  He continues Lenvima.  He reports his blood pressure is running in the 135/80 range.  He had increased pain at the upper back and left shoulder 2 weeks ago.  He also had acute pain in the left hip area.  The pain has improved.  He takes ibuprofen as needed.  He took hydrocodone twice daily when the upper back pain was more intense. ?He has a good appetite.  He reports a 5 pound weight loss over the past month.  He continues to have decreased vision in the right eye.  He is seeing ophthalmology and has been referred to neuro-ophthalmology.  He is taking eyedrops for glaucoma. ?Gregory Snow has identified an endocrinologist in [place] and would like to schedule an appointment there.  He attempted to schedule a bone scan, but the prescription was outdated. ? ? ?Lab Results: ?Thyroglobulin 02/18/2022-9552.6, 12/25/2021-7969.6, 06/18/2021-8912 ? ?Medications: I have reviewed the patient's current medications. ? ?Assessment/Plan: ?Metastatic papillary thyroid cancer, palpable left thyroid mass confirmed on a CT 09/22/2012.   ?Multiple destructive bone lesions including a large lesion at L3 with a soft tissue component encroaching on the thecal sac.   ?Status post  total thyroidectomy 11/02/2012 with pathology confirming a papillary thyroid carcinoma, follicular variant.   ?Markedly elevated thyroglobulin level.   ?Followed by Dr. Buddy Duty, treated with radioactive iodine.   ?08/08/2017 iodine-131 scan- multiple sites of abnormal radioiodine accumulation compatible with osseous metastasis.  Single new focus of abnormal tracer localization identified in a lateral mid left rib.  Remaining sites of abnormal uptake including proximal left femur were identified on the previous exam. ?01/05/2018 thyroglobulin 1523 (1186 on 06/30/2017) ?04/07/2018 thyroglobulin level greater than 500 ?Initiation of Lenvatinib 04/17/2018 ?Thyroglobulin level stable 11/02/2019 ?Lenvima placed on hold 11/16/2019 secondary to elevated liver enzymes and proteinuria ?Lymphedema resumed at a dose of 10 mg daily 01/31/2020 ?Thyroglobulin higher 08/26/2020 and 12/18/2020, stable 06/18/2021 ?Pain secondary to the L3 mass with compression of the left L3 nerve root. The pain is also also related to the left iliac mass.  He is status post palliative radiation to the lumbar spine and left iliac bone, completed 11/09/2012. ?Tobacco use.  We discussed smoking cessation.  He is not ready to quit smoking. ?Pain at the left upper leg-x-ray 03/24/2018 revealed a large lytic lesion in the proximal left femoral diaphysis.  Referral to orthopedic surgery recommended.  Patient declined. ?Proteinuria 05/22/2018- potentially related to lenvatinib  ?hypertension- progressive while on the lenvatinib,the atenolol was increased to twice daily, losartan added 06/12/2018, improved ?Hyperpigmented mole at the right upper back noted 10/16/2018 ?Elevated liver enzymes January 2021-potentially related to [facility], referred to gastroenterology, normalized on 01/22/2020 ?Anemia January 2021-macrocytic, improved ?Pathologic fracture of the left femur 05/07/2020-surgical fixation at [facility] ?  ?  ? ? ?  Disposition: Gregory Snow has  metastatic papillary thyroid cancer.  He continues treatment with Lenvima.  His overall clinical status appears stable.  The thyroglobulin level has not changed significantly since August 2022.  I recommended he schedule a restaging bone scan.  He plans to schedule appointment with endocrinologist in [place] for management of the thyroid hormone replacement and thyroid cancer.  He would like to continue follow-up here. ? ?He will be scheduled for a lab and telehealth visit in 3 months. ? ?He reports an appointment is being scheduled with neuro-ophthalmology to evaluate the right eye vision loss. ? ?We will arrange for ibuprofen to be mailed to Gregory Snow ? ? ?I discussed the assessment and treatment plan with the patient. The patient was provided an opportunity to ask questions and all were answered. The patient agreed with the plan and demonstrated an understanding of the instructions. ?  ?The patient was advised to call back or seek an in-person evaluation if the symptoms worsen or if the condition fails to improve as anticipated. ? ?I provided 35 minutes of chart review, video, and documentation time during this encounter, and > 50% was spent counseling as documented under my assessment & plan. ? ?Betsy Coder ANP/GNP-BC  ? ?03/02/2022 3:21 PM ? ? ? ? ? ? ? ?

## 2022-03-02 NOTE — Progress Notes (Signed)
Called in again his script for Ibuprofen 800 mg per Dr. Benay Spice request. He has not received it yet. ?Refilled Lenvatinib per MD order. Faxed order for NM whole body bone scan to [facility] at fax [phone]. ?Phone [phone]. ?Mailed script to patient as well as script for labs there in July for: ?CBC/diff ?CMP ?TSH ?Thyroblobulin by IMA (code [phone]) ?

## 2022-03-03 ENCOUNTER — Other Ambulatory Visit: Payer: Self-pay | Admitting: *Deleted

## 2022-03-03 ENCOUNTER — Encounter: Payer: Self-pay | Admitting: *Deleted

## 2022-03-03 ENCOUNTER — Other Ambulatory Visit (HOSPITAL_COMMUNITY): Payer: Self-pay

## 2022-03-03 DIAGNOSIS — C73 Malignant neoplasm of thyroid gland: Secondary | ICD-10-CM

## 2022-03-03 NOTE — Progress Notes (Signed)
Bone scan entered in Epic for managed care to accomplish PA. Being done at [facility] in [place] where he now resides.  ?Have repeatedly attempted to fax order to [facility] at [phone] (number confirmed) without success. Mailing script to patient to take with him.  ?

## 2022-04-06 ENCOUNTER — Encounter: Payer: Self-pay | Admitting: *Deleted

## 2022-04-06 NOTE — Progress Notes (Signed)
Received ROI from [facility] for medical records for his new patient appointment there. Phone # [phone] Fax [phone] Faxed request to [facility] HIM [phone]

## 2022-04-13 ENCOUNTER — Other Ambulatory Visit: Payer: Self-pay | Admitting: *Deleted

## 2022-04-13 DIAGNOSIS — C73 Malignant neoplasm of thyroid gland: Secondary | ICD-10-CM

## 2022-04-13 MED ORDER — LENVIMA (10 MG DAILY DOSE) 10 MG PO CPPK: ORAL_CAPSULE | ORAL | 1 refills | Status: DC

## 2022-04-13 NOTE — Telephone Encounter (Signed)
Received faxed request from [place] for [facility] refill.

## 2022-05-12 ENCOUNTER — Other Ambulatory Visit: Payer: Self-pay | Admitting: Oncology

## 2022-05-12 ENCOUNTER — Other Ambulatory Visit (HOSPITAL_COMMUNITY): Payer: Self-pay

## 2022-05-12 MED ORDER — LOSARTAN POTASSIUM 50 MG PO TABS
ORAL_TABLET | ORAL | 0 refills | Status: DC
  Filled 2022-05-12: qty 90, 90d supply, fill #0

## 2022-05-12 MED ORDER — LEVOTHYROXINE SODIUM 175 MCG PO TABS
ORAL_TABLET | ORAL | 0 refills | Status: DC
  Filled 2022-05-12: qty 90, 90d supply, fill #0

## 2022-06-01 ENCOUNTER — Other Ambulatory Visit (HOSPITAL_COMMUNITY): Payer: Self-pay

## 2022-06-01 ENCOUNTER — Inpatient Hospital Stay: Payer: Federal, State, Local not specified - PPO | Attending: Oncology | Admitting: Oncology

## 2022-06-01 DIAGNOSIS — C73 Malignant neoplasm of thyroid gland: Secondary | ICD-10-CM

## 2022-06-01 MED ORDER — ALPRAZOLAM 0.5 MG PO TABS
ORAL_TABLET | ORAL | 0 refills | Status: DC
  Filled 2022-06-01: qty 30, 15d supply, fill #0

## 2022-06-01 MED ORDER — IBUPROFEN 800 MG PO TABS
800.0000 mg | ORAL_TABLET | Freq: Every day | ORAL | 1 refills | Status: DC | PRN
  Filled 2022-06-01: qty 30, 30d supply, fill #0

## 2022-06-01 NOTE — Progress Notes (Signed)
[facility] OFFICE VISIT PROGRESS NOTE  I connected with Gregory Snow on 06/01/22 at  3:00 PM EDT by telephone and verified that I am speaking with the correct person using two identifiers.   I discussed the limitations, risks, security and privacy concerns of performing an evaluation and management service by telemedicine and the availability of in-person appointments. I also discussed with the patient that there may be a patient responsible charge related to this service. The patient expressed understanding and agreed to proceed.   Patient's location: Home Provider's location: Office Diagnosis: Thyroid cancer  INTERVAL HISTORY:   Gregory Snow is seen today for a telehealth visit per his request.  The visit started as a video visit, but we were not able to connect by video.  We proceeded with a telephone visit. He continues lenvatinib.  He reports his blood pressure has been as high as 145/80.  He continues to have left hip discomfort with ambulation.  No other consistent pain.  Takes ibuprofen as needed for pain. He has establish care with an endocrinologist, Dr. Beryle Beams in [place].  He reports the thyroglobulin level in their lab returned elevated at 12,000.  He is scheduled for a follow-up visit.  He has not undergone a staging bone scan. He has a good appetite.  He continues to have decreased vision in the right eye.   Lab Results:  Lab Results  Component Value Date   WBC 9.0 10/16/2018   HGB 14.7 10/16/2018   HCT 43.6 10/16/2018   MCV 99.5 10/16/2018   PLT 188 10/16/2018   NEUTROABS 7.2 10/16/2018    Medications: I have reviewed the patient's current medications.  Assessment/Plan: Metastatic papillary thyroid cancer, palpable left thyroid mass confirmed on a CT 09/22/2012.   Multiple destructive bone lesions including a large lesion at L3 with a soft tissue component encroaching on the thecal sac.   Status post total  thyroidectomy 11/02/2012 with pathology confirming a papillary thyroid carcinoma, follicular variant.   Markedly elevated thyroglobulin level.   Followed by Dr. Buddy Duty, treated with radioactive iodine.   08/08/2017 iodine-131 scan- multiple sites of abnormal radioiodine accumulation compatible with osseous metastasis.  Single new focus of abnormal tracer localization identified in a lateral mid left rib.  Remaining sites of abnormal uptake including proximal left femur were identified on the previous exam. 01/05/2018 thyroglobulin 1523 (1186 on 06/30/2017) 04/07/2018 thyroglobulin level greater than 500 Initiation of Lenvatinib 04/17/2018 Thyroglobulin level stable 11/02/2019 Lenvima placed on hold 11/16/2019 secondary to elevated liver enzymes and proteinuria Lymphedema resumed at a dose of 10 mg daily 01/31/2020 Thyroglobulin higher 08/26/2020 and 12/18/2020, stable 06/18/2021 Pain secondary to the L3 mass with compression of the left L3 nerve root. The pain is also also related to the left iliac mass.  He is status post palliative radiation to the lumbar spine and left iliac bone, completed 11/09/2012. Tobacco use.  We discussed smoking cessation.  He is not ready to quit smoking. Pain at the left upper leg-x-ray 03/24/2018 revealed a large lytic lesion in the proximal left femoral diaphysis.  Referral to orthopedic surgery recommended.  Patient declined. Proteinuria 05/22/2018- potentially related to lenvatinib  hypertension- progressive while on the lenvatinib,the atenolol was increased to twice daily, losartan added 06/12/2018, improved Hyperpigmented mole at the right upper back noted 10/16/2018 Elevated liver enzymes January 2021-potentially related to [facility], referred to gastroenterology, normalized on 01/22/2020 Anemia January 2021-macrocytic, improved Pathologic fracture of the left femur 05/07/2020-surgical fixation at [facility]      Disposition: Gregory Colocho has metastatic  thyroid cancer.  The thyroglobulin level has not changed significantly when checked over the past year.  He continues lenvatinib.  I recommend a restaging bone scan, he has not completed the study.  He is now being followed by endocrinologist in [place] and reports he will obtain an oncologist in [place].  The plan is to continue lenvatinib for now.  He will be scheduled for a telehealth visit in 3 months.  I refilled his prescriptions for ibuprofen and Xanax.  We we will request the recent lab studies by Dr. Beryle Beams be forwarded to [place].   I discussed the assessment and treatment plan with the patient. The patient was provided an opportunity to ask questions and all were answered. The patient agreed with the plan and demonstrated an understanding of the instructions.   The patient was advised to call back or seek an in-person evaluation if the symptoms worsen or if the condition fails to improve as anticipated.  I provided 25 minutes minutes of chart review, telephone, and documentation time during this encounter, and > 50% was spent counseling as documented under my assessment & plan.  Betsy Coder ANP/GNP-BC   06/01/2022 3:27 PM

## 2022-06-02 ENCOUNTER — Other Ambulatory Visit (HOSPITAL_COMMUNITY): Payer: Self-pay

## 2022-06-03 ENCOUNTER — Encounter: Payer: Self-pay | Admitting: *Deleted

## 2022-06-03 NOTE — Progress Notes (Signed)
Mailed to home script for labs prior to next telehealth visit in 3 months for: CBC/diff, CMP and thyroglobulin level.  Called his endocrinologist Dr. Larena Sox and requested they fax/email his recent labs to office. Have not been able to get fax sent there and according to [facility] at their office, fax to this office will not go through either.

## 2022-06-09 ENCOUNTER — Other Ambulatory Visit: Payer: Self-pay | Admitting: *Deleted

## 2022-06-09 DIAGNOSIS — C73 Malignant neoplasm of thyroid gland: Secondary | ICD-10-CM

## 2022-06-09 MED ORDER — LENVIMA (10 MG DAILY DOSE) 10 MG PO CPPK: ORAL_CAPSULE | ORAL | 1 refills | Status: DC

## 2022-06-09 NOTE — Telephone Encounter (Signed)
Received faxed refill request from [facility] for [facility]. OK to refill per Dr. Benay Spice

## 2022-06-11 ENCOUNTER — Other Ambulatory Visit (HOSPITAL_COMMUNITY): Payer: Self-pay

## 2022-06-11 ENCOUNTER — Other Ambulatory Visit: Payer: Self-pay | Admitting: Oncology

## 2022-06-11 MED ORDER — ATENOLOL 50 MG PO TABS
50.0000 mg | ORAL_TABLET | Freq: Every day | ORAL | 0 refills | Status: DC
  Filled 2022-06-11: qty 90, 90d supply, fill #0

## 2022-07-27 ENCOUNTER — Other Ambulatory Visit (HOSPITAL_COMMUNITY): Payer: Self-pay

## 2022-07-27 ENCOUNTER — Other Ambulatory Visit: Payer: Self-pay | Admitting: Oncology

## 2022-07-27 MED ORDER — LOSARTAN POTASSIUM 50 MG PO TABS
50.0000 mg | ORAL_TABLET | Freq: Every day | ORAL | 0 refills | Status: DC
  Filled 2022-07-27: qty 90, 90d supply, fill #0

## 2022-07-29 ENCOUNTER — Other Ambulatory Visit: Payer: Self-pay | Admitting: Oncology

## 2022-07-29 DIAGNOSIS — C73 Malignant neoplasm of thyroid gland: Secondary | ICD-10-CM

## 2022-08-17 ENCOUNTER — Telehealth: Payer: Self-pay | Admitting: *Deleted

## 2022-08-17 ENCOUNTER — Other Ambulatory Visit: Payer: Self-pay

## 2022-08-17 ENCOUNTER — Other Ambulatory Visit: Payer: Self-pay | Admitting: Oncology

## 2022-08-17 MED ORDER — LEVOTHYROXINE SODIUM 175 MCG PO TABS
175.0000 ug | ORAL_TABLET | Freq: Every day | ORAL | 0 refills | Status: DC
  Filled 2022-08-17: qty 90, 90d supply, fill #0

## 2022-08-17 NOTE — Telephone Encounter (Signed)
Called Gregory Snow to inquire if he has had his lab work done yet to prepare for appointment on 10/19 and he has not. Tells RN he will try--says his girlfriend is the driver and she is having issues with her eyes. Asking if he should reschedule the video visit to after he has labs collected? Also will Dr. Benay Spice be willing to write order for the bone scan he had wanted? Wants to have it at [facility].

## 2022-08-18 ENCOUNTER — Other Ambulatory Visit: Payer: Self-pay

## 2022-08-31 ENCOUNTER — Encounter: Payer: Self-pay | Admitting: *Deleted

## 2022-08-31 NOTE — Progress Notes (Signed)
Faxed bone scan orders and facesheet to [facility] central scheduling in [place] [phone]. Mailed copy of order to patient in case it gets lost at [place].

## 2022-09-02 ENCOUNTER — Other Ambulatory Visit (HOSPITAL_COMMUNITY): Payer: Self-pay

## 2022-09-02 ENCOUNTER — Telehealth: Payer: Self-pay | Admitting: Oncology

## 2022-09-02 ENCOUNTER — Inpatient Hospital Stay: Payer: Federal, State, Local not specified - PPO | Attending: Oncology | Admitting: Oncology

## 2022-09-02 ENCOUNTER — Encounter: Payer: Self-pay | Admitting: *Deleted

## 2022-09-02 ENCOUNTER — Other Ambulatory Visit: Payer: Self-pay | Admitting: *Deleted

## 2022-09-02 DIAGNOSIS — C73 Malignant neoplasm of thyroid gland: Secondary | ICD-10-CM

## 2022-09-02 MED ORDER — ATENOLOL 50 MG PO TABS
50.0000 mg | ORAL_TABLET | Freq: Every day | ORAL | 3 refills | Status: DC
  Filled 2022-09-02: qty 90, 90d supply, fill #0

## 2022-09-02 MED ORDER — IBUPROFEN 800 MG PO TABS
800.0000 mg | ORAL_TABLET | Freq: Every day | ORAL | 3 refills | Status: DC | PRN
  Filled 2022-09-02: qty 30, 30d supply, fill #0
  Filled 2022-10-22 – 2022-10-27 (×2): qty 30, 30d supply, fill #1

## 2022-09-02 MED ORDER — TRAMADOL HCL 50 MG PO TABS
50.0000 mg | ORAL_TABLET | Freq: Two times a day (BID) | ORAL | 0 refills | Status: DC | PRN
  Filled 2022-09-02: qty 60, 30d supply, fill #0

## 2022-09-02 NOTE — Progress Notes (Signed)
[facility] OFFICE VISIT PROGRESS NOTE  I connected with Gregory Snow on 09/02/22 at 12:30 PM EDT by video and verified that I am speaking with the correct person using two identifiers.   I discussed the limitations, risks, security and privacy concerns of performing an evaluation and management service by telemedicine and the availability of in-person appointments. I also discussed with the patient that there may be a patient responsible charge related to this service. The patient expressed understanding and agreed to proceed.   Patient's location: Home Provider's location: Office   Diagnosis: Thyroid cancer  INTERVAL HISTORY:   Gregory Snow is seen today for a telehealth visit per his request.  He continues lenvatinib.  He reports dry skin and fatigue.  His chief complaint is pain at the left hip and upper thigh.  The pain increased after he injured the leg coming over a fence 2 weeks ago.  He takes ibuprofen for pain.  He requests a refill on tramadol.  He has not undergone a bone scan.  He has not scheduled an oncology appointment in [place].  The left upper thigh is swollen and "heavy "compared to the right side.  No lower leg edema. He reports his blood pressure has been running in the 130s over 80s. Objective:    Lab Results:  Lab Results  Component Value Date   WBC 9.0 10/16/2018   HGB 14.7 10/16/2018   HCT 43.6 10/16/2018   MCV 99.5 10/16/2018   PLT 188 10/16/2018   NEUTROABS 7.2 10/16/2018   Thyroglobulin 08/24/2022: 21,266   Medications: I have reviewed the patient's current medications.  Assessment/Plan: Metastatic papillary thyroid cancer, palpable left thyroid mass confirmed on a CT 09/22/2012.   Multiple destructive bone lesions including a large lesion at L3 with a soft tissue component encroaching on the thecal sac.   Status post total thyroidectomy 11/02/2012 with pathology confirming a papillary  thyroid carcinoma, follicular variant.   Markedly elevated thyroglobulin level.   Followed by Dr. Buddy Duty, treated with radioactive iodine.   08/08/2017 iodine-131 scan- multiple sites of abnormal radioiodine accumulation compatible with osseous metastasis.  Single new focus of abnormal tracer localization identified in a lateral mid left rib.  Remaining sites of abnormal uptake including proximal left femur were identified on the previous exam. 01/05/2018 thyroglobulin 1523 (1186 on 06/30/2017) 04/07/2018 thyroglobulin level greater than 500 Initiation of Lenvatinib 04/17/2018 Thyroglobulin level stable 11/02/2019 Lenvima placed on hold 11/16/2019 secondary to elevated liver enzymes and proteinuria Lymphedema resumed at a dose of 10 mg daily 01/31/2020 Thyroglobulin higher 08/26/2020 and 12/18/2020, stable 06/18/2021 Thyroglobulin level higher 08/24/2022 Pain secondary to the L3 mass with compression of the left L3 nerve root. The pain is also also related to the left iliac mass.  He is status post palliative radiation to the lumbar spine and left iliac bone, completed 11/09/2012. Tobacco use.  We discussed smoking cessation.  He is not ready to quit smoking. Pain at the left upper leg-x-ray 03/24/2018 revealed a large lytic lesion in the proximal left femoral diaphysis.  Referral to orthopedic surgery recommended.  Patient declined. Proteinuria 05/22/2018- potentially related to lenvatinib  hypertension- progressive while on the lenvatinib,the atenolol was increased to twice daily, losartan added 06/12/2018, improved Hyperpigmented mole at the right upper back noted 10/16/2018 Elevated liver enzymes January 2021-potentially related to [facility], referred to gastroenterology, normalized on 01/22/2020 Anemia January 2021-macrocytic, improved Pathologic fracture of the left femur 05/07/2020-surgical fixation at [facility]  Disposition: [name] has metastatic thyroid cancer.  He is  currently treated with lenvatinib.  His increased pain at the left "hip "and the thyroglobulin level is higher.  I am concerned he has slow disease progression.  I recommended an I-131 nuclear medicine study and MRI of the left hip.  I recommended he seek medical attention now evaluate swelling at the left thigh and rule out deep vein thrombosis.  He will be scheduled for a thyroglobulin level prior to an office visit in 1 month.  He says he cannot return to [place] at least for the remainder of this year.  I recommended he obtain an oncologist in the [place] area.  I refilled prescriptions for ibuprofen, tramadol, and atenolol.   I discussed the assessment and treatment plan with the patient. The patient was provided an opportunity to ask questions and all were answered. The patient agreed with the plan and demonstrated an understanding of the instructions.   The patient was advised to call back or seek an in-person evaluation if the symptoms worsen or if the condition fails to improve as anticipated.  I provided 30 minutes minutes of chart review, documentation, and video time during this encounter, and > 50% was spent counseling as documented under my assessment & plan.  Betsy Coder ANP/GNP-BC   09/02/2022 1:09 PM

## 2022-09-02 NOTE — Progress Notes (Signed)
Placed order for MRI right hip at [facility] in [place]. Notified managed care to determine if PA is needed.

## 2022-09-02 NOTE — Telephone Encounter (Signed)
Reached out to patient to schedule follow up visit per patient he wanted to schedule after thanksgiving for his next video vist

## 2022-09-02 NOTE — Progress Notes (Signed)
Faxed new order for NM bone scan total body I 131 scan to [facility] central scheduling [phone]. Mailed order to patient as well with message to call central scheduling to schedule scan at [phone] if he has not heard from them. Insurance did not require PA.

## 2022-09-03 ENCOUNTER — Other Ambulatory Visit (HOSPITAL_COMMUNITY): Payer: Self-pay

## 2022-09-08 ENCOUNTER — Encounter: Payer: Self-pay | Admitting: *Deleted

## 2022-09-08 NOTE — Progress Notes (Signed)
Faxed MRI order to [facility] central scheduling in [place] [phone] and mailed copy to his home with prescription for lab work prior to next visit with Dr. Benay Spice. Will need to have the labs collected before his MRI as well.

## 2022-09-16 ENCOUNTER — Telehealth: Payer: Self-pay | Admitting: *Deleted

## 2022-09-16 NOTE — Telephone Encounter (Addendum)
Reports he heard/felt a "pop" at right clavicle on 10/27. Was able to get to ER via EMS and xray showed no fracture, but there is still a knot there. Still having pain and with this and his left thigh/hip pain he is not able to use the walker and too unsteady to use crutch on left side. Asking following questions: Which is more urgent, the bone scan or MRI Will he consider changing his cancer therapy from lenvatinib to something else? He understands he would be best served to find a Museum/gallery conservator and says "I plan to do that when I can get out and about".   Per Dr. Benay Spice:  The bone scan is the higher priority and does not plan to change therapy until this scan is done. He needs an oncologist in [place]. Notified patient via [place].

## 2022-10-04 ENCOUNTER — Other Ambulatory Visit: Payer: Self-pay | Admitting: Oncology

## 2022-10-04 DIAGNOSIS — C73 Malignant neoplasm of thyroid gland: Secondary | ICD-10-CM

## 2022-10-11 ENCOUNTER — Inpatient Hospital Stay: Payer: Federal, State, Local not specified - PPO | Attending: Oncology | Admitting: Oncology

## 2022-10-11 DIAGNOSIS — C73 Malignant neoplasm of thyroid gland: Secondary | ICD-10-CM | POA: Diagnosis not present

## 2022-10-11 NOTE — Progress Notes (Signed)
[place] OFFICE VISIT PROGRESS NOTE  I connected with Gregory Snow on 10/11/22 at 12:00 PM EST by video and verified that I am speaking with the correct person using two identifiers.   I discussed the limitations, risks, security and privacy concerns of performing an evaluation and management service by telemedicine and the availability of in-person appointments. I also discussed with the patient that there may be a patient responsible charge related to this service. The patient expressed understanding and agreed to proceed.   Patient's location: home  Provider's location: office     Diagnosis: Thyroid cancer  INTERVAL HISTORY:   Gregory Snow is seen today for a telehealth visit per his request.  He continues to live near [place].  He reports improvement in right shoulder pain.  He has persistent pain at the left femur.  The pain is worse with ambulation.  He has pain in the right shoulder when he puts weight on the right side when ambulating with a walker.  He continues lenvatinib.  He reports the blood pressure has been running in the 140/80-90 range. He takes ibuprofen and occasional hydrocodone for pain.  He has noted a "knot "overlying the right clavicle.  Objective:  Via the video monitor there appears to be a fullness at the mid to lateral right clavicle   Lab Results:  Lab Results  Component Value Date   WBC 9.0 10/16/2018   HGB 14.7 10/16/2018   HCT 43.6 10/16/2018   MCV 99.5 10/16/2018   PLT 188 10/16/2018   NEUTROABS 7.2 10/16/2018    Medications: I have reviewed the patient's current medications.  Assessment/Plan: Metastatic papillary thyroid cancer, palpable left thyroid mass confirmed on a CT 09/22/2012.   Multiple destructive bone lesions including a large lesion at L3 with a soft tissue component encroaching on the thecal sac.   Status post total thyroidectomy 11/02/2012 with pathology confirming  a papillary thyroid carcinoma, follicular variant.   Markedly elevated thyroglobulin level.   Followed by Dr. Buddy Duty, treated with radioactive iodine.  Last treated with radioactive iodine 9/12/20189/24/2018 iodine-131 scan- multiple sites of abnormal radioiodine accumulation compatible with osseous metastasis.  Single new focus of abnormal tracer localization identified in a lateral mid left rib.  Remaining sites of abnormal uptake including proximal left femur were identified on the previous exam. 01/05/2018 thyroglobulin 1523 (1186 on 06/30/2017) 04/07/2018 thyroglobulin level greater than 500 Initiation of Lenvatinib 04/17/2018 Thyroglobulin level stable 11/02/2019 Lenvima placed on hold 11/16/2019 secondary to elevated liver enzymes and proteinuria Lymphedema resumed at a dose of 10 mg daily 01/31/2020 Thyroglobulin higher 08/26/2020 and 12/18/2020, stable 06/18/2021 Thyroglobulin level higher 08/24/2022 Whole-body bone scan 10/01/2022-multi focal osseous metastatic disease including lesions at the left calvarium, multiple ribs, thoracic spine, right mid/distal clavicle, pelvis, and left femur Lenvatinib discontinued 10/11/2022 Pain secondary to the L3 mass with compression of the left L3 nerve root. The pain is also also related to the left iliac mass.  He is status post palliative radiation to the lumbar spine and left iliac bone, completed 11/09/2012. Tobacco use.  We discussed smoking cessation.  He is not ready to quit smoking. Pain at the left upper leg-x-ray 03/24/2018 revealed a large lytic lesion in the proximal left femoral diaphysis.  Referral to orthopedic surgery recommended.  Patient declined. Proteinuria 05/22/2018- potentially related to lenvatinib  hypertension- progressive while on the lenvatinib,the atenolol was increased to twice daily, losartan added 06/12/2018, improved Hyperpigmented mole at the right upper back noted  10/16/2018 Elevated liver enzymes January 2021-potentially related to  [facility], referred to gastroenterology, normalized on 01/22/2020 Anemia January 2021-macrocytic, improved Pathologic fracture of the left femur 05/07/2020-surgical fixation at [facility]      Disposition: Gregory Snow has metastatic papillary thyroid cancer.  He has been treated with radioactive iodine, external beam radiation, and lenvatinib since diagnosis in 2013.  He has been maintained on lenvatinib since June 2019.  There is no clinical, radiologic, laboratory evidence of disease progression.  He will discontinue lenvatinib.  I discussed treatment options with Gregory Snow including changing to a different tyrosine kinase inhibitor, standard chemotherapy, and repeat treatment with radioactive iodine.  I recommend obtaining an I-131 scan to see if the tumor takes up radioactive iodine.  He may be a candidate for repeat treatment with I-131.  I recommend peripheral blood or tissue next generation sequencing to see whether he may be a candidate for targeted therapy.  Gregory Snow lives a great distance from the [facility] cancer center.  I encouraged him to obtain an oncologist closer to home in order to be monitored closely with a new treatment.  He agrees to a referral to the [facility] cancer center in [place].  He will contact us this week with the name of the oncologist there.  We will make a referral.  He will continue ibuprofen, hydrocodone, and tramadol as needed for pain.  He will be scheduled for a telehealth visit in early January.  I am available to participate in his care in the interim.  I discussed the assessment and treatment plan with the patient. The patient was provided an opportunity to ask questions and all were answered. The patient agreed with the plan and demonstrated an understanding of the instructions.   The patient was advised to call back or seek an in-person evaluation if the symptoms worsen or if the condition fails to improve as anticipated.  I  provided 40 minutes of chart review, video, and documentation time during this encounter, and > 50% was spent counseling as documented under my assessment & plan.  Betsy Coder ANP/GNP-BC   10/11/2022 12:36 PM

## 2022-10-20 ENCOUNTER — Telehealth: Payer: Self-pay | Admitting: *Deleted

## 2022-10-20 NOTE — Telephone Encounter (Signed)
Faxed referral order and records to [facility] [phone].

## 2022-10-20 NOTE — Telephone Encounter (Signed)
Mr. Heinz called with following requests: Needs Dr. Benay Spice to fax order for Palliative Care to [facility] at '@[phone]'$ [phone]. They have agreed to admit him for services and will assist him in finding a local oncologist. Coordinator is Anderson Malta at [phone]. Since off Lenvatinib, does he still need to continue Losartan? If yes, needs refill. Recent BP has been 125/75 and 140/78. Also asking what Dr. Benay Spice thinks is best first approach?       A. Radioactive iodine treatment       B. Bloodwork checking for targeted therapy       C. Repeat tx with I-131

## 2022-10-22 ENCOUNTER — Other Ambulatory Visit (HOSPITAL_COMMUNITY): Payer: Self-pay

## 2022-10-22 ENCOUNTER — Other Ambulatory Visit: Payer: Self-pay | Admitting: Oncology

## 2022-10-22 MED ORDER — LOSARTAN POTASSIUM 50 MG PO TABS
50.0000 mg | ORAL_TABLET | Freq: Every day | ORAL | 0 refills | Status: DC
  Filled 2022-10-22 – 2022-10-27 (×2): qty 90, 90d supply, fill #0

## 2022-10-26 ENCOUNTER — Encounter (HOSPITAL_COMMUNITY): Payer: Self-pay | Admitting: Pharmacist

## 2022-10-26 ENCOUNTER — Other Ambulatory Visit (HOSPITAL_COMMUNITY): Payer: Self-pay

## 2022-10-27 ENCOUNTER — Other Ambulatory Visit (HOSPITAL_COMMUNITY): Payer: Self-pay

## 2022-11-02 ENCOUNTER — Telehealth: Payer: Self-pay | Admitting: *Deleted

## 2022-11-02 ENCOUNTER — Telehealth: Payer: Self-pay

## 2022-11-02 NOTE — Telephone Encounter (Signed)
Oral Oncology Patient Advocate Encounter   Received notification that prior authorization for Lenvima is due for renewal. Patient is no longer taking Lenvima so no Prior Authorization is not needed.   Gregory Snow, [facility] [place]  [phone] (phone) [phone] (fax) 11/02/2022 9:18 AM

## 2022-11-02 NOTE — Telephone Encounter (Signed)
Was able to determine that Dr. Shari Prows that saw him in hospital is no longer with [facility] [phone]). Left message for patient to call to provide guidance on who to refer him to in [place] for his oncology care.  He was informed that Dr. Benay Spice felt the best first approach is to have scan to determine if his lesions take up iodine on a scan before ordering the radioactive iodine treatment. He will not be ordering these. Needs to find local oncologist.

## 2022-11-17 ENCOUNTER — Telehealth: Payer: Self-pay

## 2022-11-17 NOTE — Telephone Encounter (Signed)
Miss Rondel Oh called to report Gregory Snow is currently in hospice care. On 11/16/22, he went to [facility] in [place], [place]. She thanks Dr Benay Spice and staff for all the care.

## 2022-11-18 ENCOUNTER — Inpatient Hospital Stay: Payer: Federal, State, Local not specified - PPO | Admitting: Oncology

## 2022-12-16 DEATH — deceased
